# Patient Record
Sex: Female | Born: 1955 | Race: White | Hispanic: No | Marital: Married | Smoking: Never smoker
Health system: Southern US, Community
[De-identification: ages and names within clinical notes are randomized; demographics above are authoritative.]

## PROBLEM LIST (undated history)

## (undated) DIAGNOSIS — G459 Transient cerebral ischemic attack, unspecified: Secondary | ICD-10-CM

## (undated) DIAGNOSIS — I10 Essential (primary) hypertension: Secondary | ICD-10-CM

## (undated) DIAGNOSIS — E119 Type 2 diabetes mellitus without complications: Secondary | ICD-10-CM

## (undated) HISTORY — PX: ABDOMINAL HYSTERECTOMY: SHX81

---

## 2020-11-27 ENCOUNTER — Emergency Department (HOSPITAL_COMMUNITY)

## 2020-11-27 ENCOUNTER — Other Ambulatory Visit: Payer: Self-pay

## 2020-11-27 ENCOUNTER — Encounter (HOSPITAL_COMMUNITY): Payer: Self-pay | Admitting: Emergency Medicine

## 2020-11-27 ENCOUNTER — Observation Stay (HOSPITAL_COMMUNITY)
Admission: EM | Admit: 2020-11-27 | Discharge: 2020-11-29 | Disposition: A | Discharge status: Home / Self Care | Attending: Internal Medicine | Admitting: Internal Medicine

## 2020-11-27 DIAGNOSIS — G4733 Obstructive sleep apnea (adult) (pediatric): Secondary | ICD-10-CM | POA: Diagnosis not present

## 2020-11-27 DIAGNOSIS — E782 Mixed hyperlipidemia: Secondary | ICD-10-CM | POA: Diagnosis present

## 2020-11-27 DIAGNOSIS — I951 Orthostatic hypotension: Secondary | ICD-10-CM | POA: Diagnosis not present

## 2020-11-27 DIAGNOSIS — I10 Essential (primary) hypertension: Secondary | ICD-10-CM | POA: Insufficient documentation

## 2020-11-27 DIAGNOSIS — Z7982 Long term (current) use of aspirin: Secondary | ICD-10-CM | POA: Diagnosis not present

## 2020-11-27 DIAGNOSIS — Z8673 Personal history of transient ischemic attack (TIA), and cerebral infarction without residual deficits: Secondary | ICD-10-CM | POA: Insufficient documentation

## 2020-11-27 DIAGNOSIS — Z7984 Long term (current) use of oral hypoglycemic drugs: Secondary | ICD-10-CM | POA: Insufficient documentation

## 2020-11-27 DIAGNOSIS — Z7902 Long term (current) use of antithrombotics/antiplatelets: Secondary | ICD-10-CM | POA: Insufficient documentation

## 2020-11-27 DIAGNOSIS — Z20822 Contact with and (suspected) exposure to covid-19: Secondary | ICD-10-CM | POA: Diagnosis not present

## 2020-11-27 DIAGNOSIS — I6523 Occlusion and stenosis of bilateral carotid arteries: Secondary | ICD-10-CM | POA: Diagnosis present

## 2020-11-27 DIAGNOSIS — F411 Generalized anxiety disorder: Secondary | ICD-10-CM | POA: Diagnosis present

## 2020-11-27 DIAGNOSIS — R55 Syncope and collapse: Secondary | ICD-10-CM

## 2020-11-27 DIAGNOSIS — E119 Type 2 diabetes mellitus without complications: Secondary | ICD-10-CM

## 2020-11-27 DIAGNOSIS — Z79899 Other long term (current) drug therapy: Secondary | ICD-10-CM | POA: Insufficient documentation

## 2020-11-27 DIAGNOSIS — R4182 Altered mental status, unspecified: Principal | ICD-10-CM | POA: Insufficient documentation

## 2020-11-27 DIAGNOSIS — R4020 Unspecified coma: Secondary | ICD-10-CM | POA: Diagnosis present

## 2020-11-27 DIAGNOSIS — I959 Hypotension, unspecified: Secondary | ICD-10-CM | POA: Diagnosis present

## 2020-11-27 HISTORY — DX: Transient cerebral ischemic attack, unspecified: G45.9

## 2020-11-27 HISTORY — DX: Type 2 diabetes mellitus without complications: E11.9

## 2020-11-27 HISTORY — DX: Essential (primary) hypertension: I10

## 2020-11-27 LAB — COMPREHENSIVE METABOLIC PANEL
ALT: 15 U/L (ref 0–44)
AST: 19 U/L (ref 15–41)
Albumin: 3.5 g/dL (ref 3.5–5.0)
Alkaline Phosphatase: 37 U/L — ABNORMAL LOW (ref 38–126)
Anion gap: 13 (ref 5–15)
BUN: 10 mg/dL (ref 8–23)
CO2: 19 mmol/L — ABNORMAL LOW (ref 22–32)
Calcium: 9.2 mg/dL (ref 8.9–10.3)
Chloride: 99 mmol/L (ref 98–111)
Creatinine, Ser: 0.88 mg/dL (ref 0.44–1.00)
GFR, Estimated: >60 mL/min (ref >60)
Glucose, Bld: 131 mg/dL — ABNORMAL HIGH (ref 70–99)
Potassium: 3.8 mmol/L (ref 3.5–5.1)
Sodium: 131 mmol/L — ABNORMAL LOW (ref 135–145)
Total Bilirubin: 0.8 mg/dL (ref 0.3–1.2)
Total Protein: 6.6 g/dL (ref 6.5–8.1)

## 2020-11-27 LAB — CBC WITH DIFFERENTIAL/PLATELET
Abs Immature Granulocytes: 0.02 10*3/uL (ref 0.00–0.07)
Basophils Absolute: 0.1 10*3/uL (ref 0.0–0.1)
Basophils Relative: 1 %
Eosinophils Absolute: 0.1 10*3/uL (ref 0.0–0.5)
Eosinophils Relative: 2 %
HCT: 38.7 % (ref 36.0–46.0)
Hemoglobin: 13 g/dL (ref 12.0–15.0)
Immature Granulocytes: 0 %
Lymphocytes Relative: 28 %
Lymphs Abs: 2.3 10*3/uL (ref 0.7–4.0)
MCH: 29.6 pg (ref 26.0–34.0)
MCHC: 33.6 g/dL (ref 30.0–36.0)
MCV: 88.2 fL (ref 80.0–100.0)
Monocytes Absolute: 0.6 10*3/uL (ref 0.1–1.0)
Monocytes Relative: 8 %
Neutro Abs: 5.1 10*3/uL (ref 1.7–7.7)
Neutrophils Relative %: 61 %
Platelets: 411 10*3/uL — ABNORMAL HIGH (ref 150–400)
RBC: 4.39 MIL/uL (ref 3.87–5.11)
RDW: 13.5 % (ref 11.5–15.5)
WBC: 8.3 10*3/uL (ref 4.0–10.5)
nRBC: 0 % (ref 0.0–0.2)

## 2020-11-27 LAB — POC SARS CORONAVIRUS 2 AG -  ED: SARS Coronavirus 2 Ag: NEGATIVE

## 2020-11-27 LAB — D-DIMER, QUANTITATIVE: D-Dimer, Quant: 0.52 ug/mL-FEU — ABNORMAL HIGH (ref 0.00–0.50)

## 2020-11-27 LAB — TROPONIN I (HIGH SENSITIVITY)
Troponin I (High Sensitivity): 5 ng/L (ref <18)
Troponin I (High Sensitivity): 5 ng/L (ref <18)

## 2020-11-27 LAB — I-STAT BETA HCG BLOOD, ED (MC, WL, AP ONLY): I-stat hCG, quantitative: <5 m[IU]/mL (ref <5)

## 2020-11-27 LAB — CBG MONITORING, ED: Glucose-Capillary: 126 mg/dL — ABNORMAL HIGH (ref 70–99)

## 2020-11-27 IMAGING — DX DG CHEST 1V PORT
1 series · 1 of 1 positions shown · non-contrast
Comparison: None

CLINICAL DATA: Hypertension, syncope, altered mental status.

EXAM:
PORTABLE CHEST 1 VIEW

[chest ap]
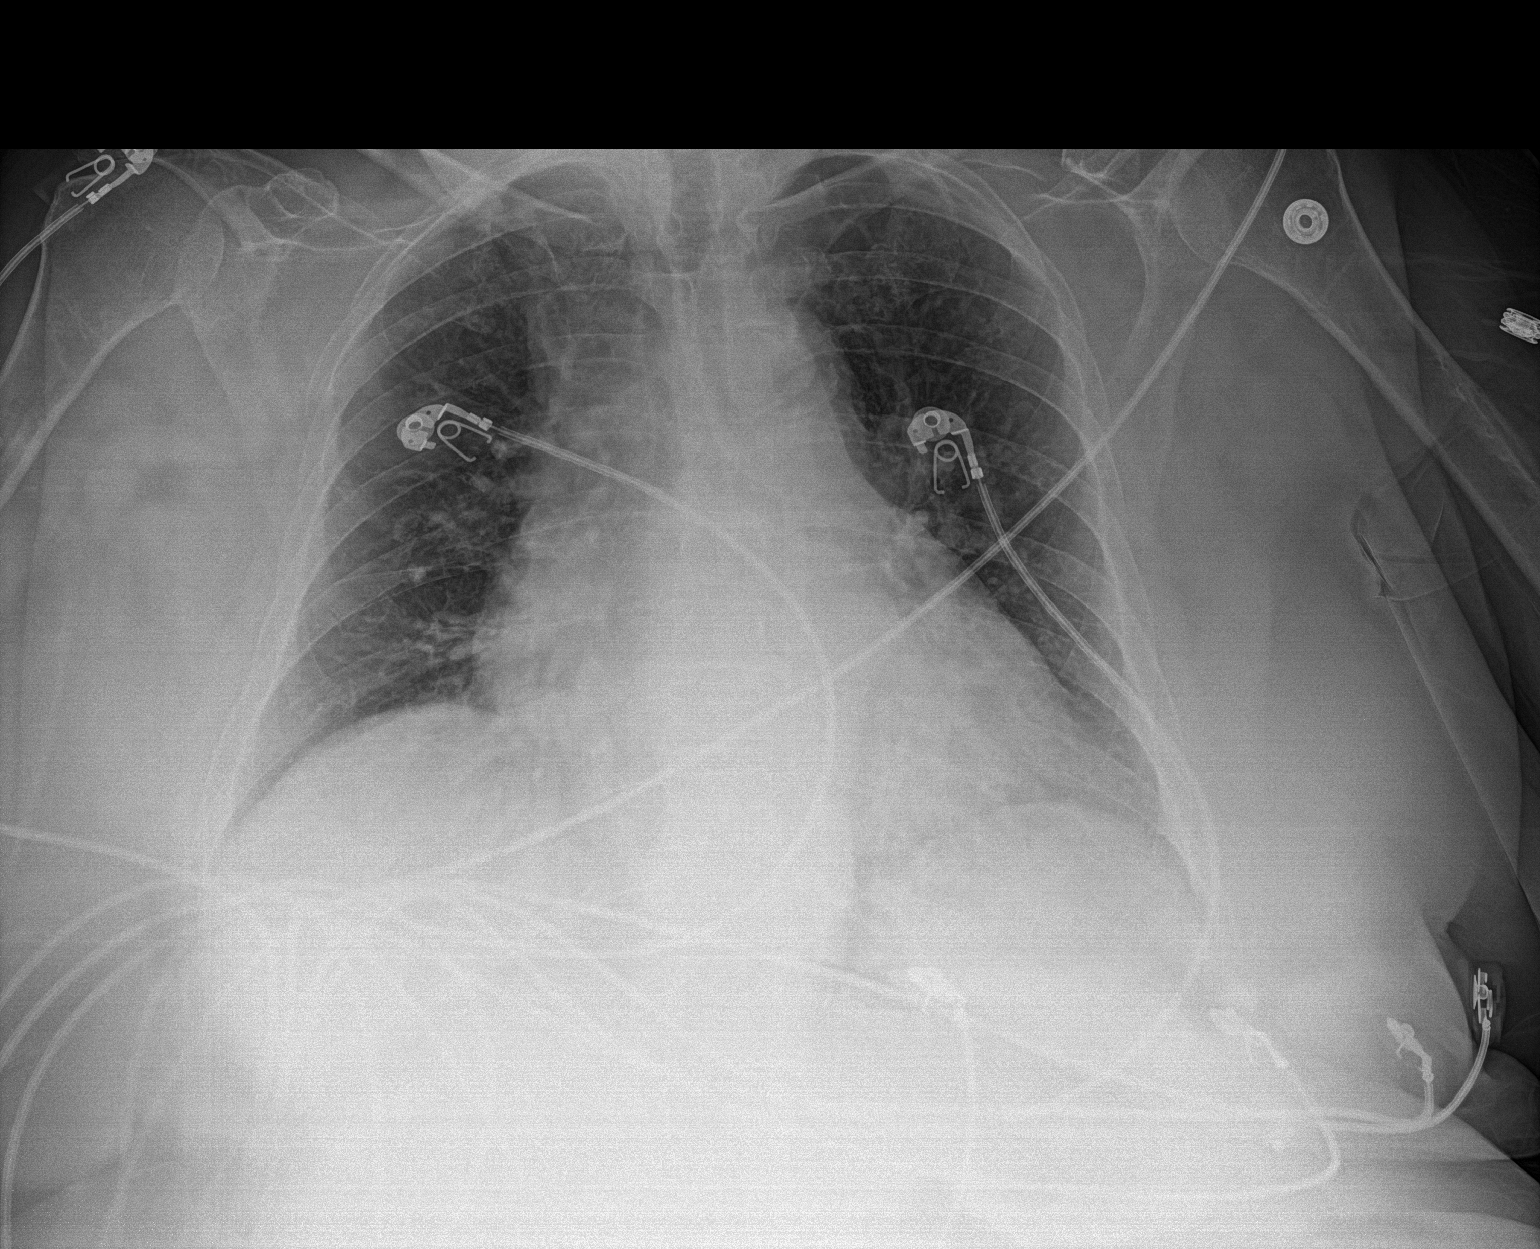

[1 of 1 positions shown; findings below may reference images not displayed]

FINDINGS: Heart is mildly enlarged. No confluent airspace opacities, effusions
or edema. Mediastinal contours within normal limits. No acute bony
abnormality.
IMPRESSION: Cardiomegaly.  No active disease.

## 2020-11-27 IMAGING — US US AORTA
1 series · 11 of 11 positions shown · non-contrast
Comparison: None.

CLINICAL DATA: Hypotension, syncope.  Abdominal aneurysm?

EXAM:
ULTRASOUND OF ABDOMINAL AORTA
TECHNIQUE: Ultrasound examination of the abdominal aorta and proximal common
iliac arteries was performed to evaluate for aneurysm. Additional
color and Doppler images of the distal aorta were obtained to
document patency.

[Series 1: us aorta · 11 of 11 slices shown]
[im 1/11]
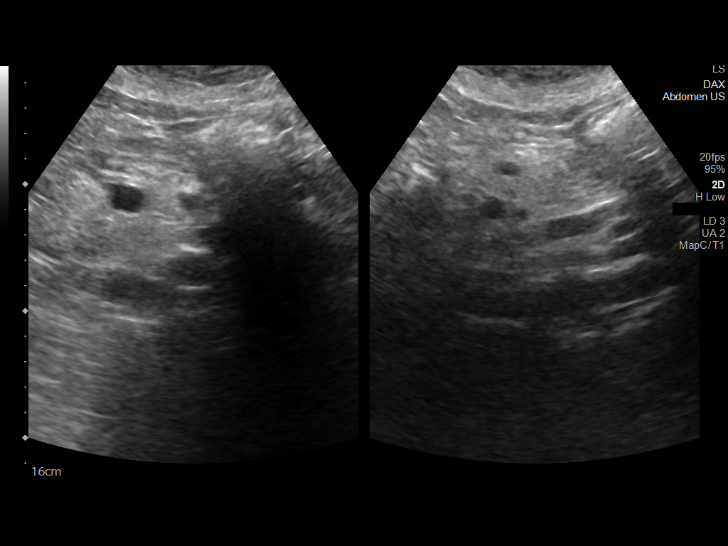
[im 2/11]
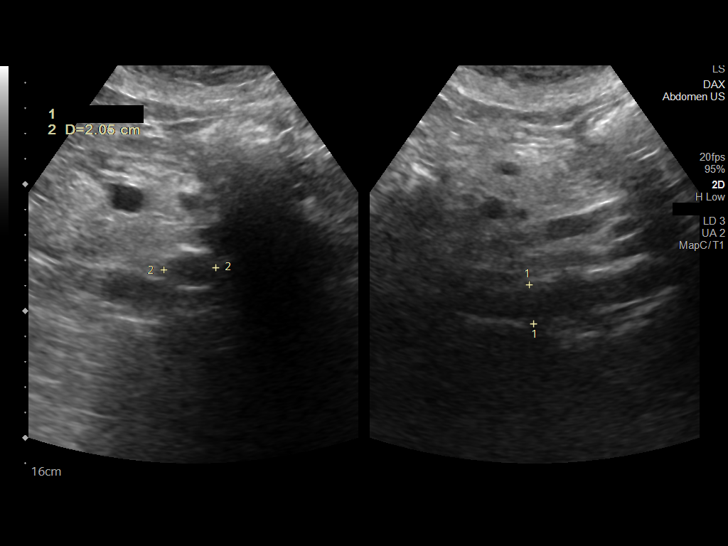
[im 3/11]
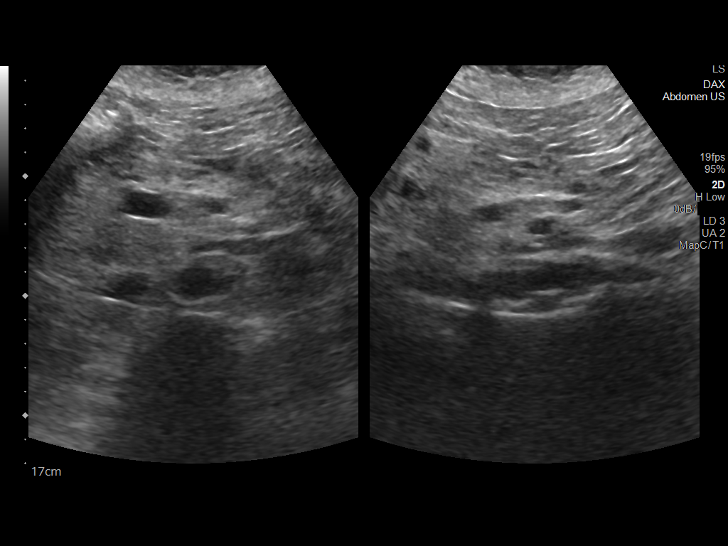
[im 4/11]
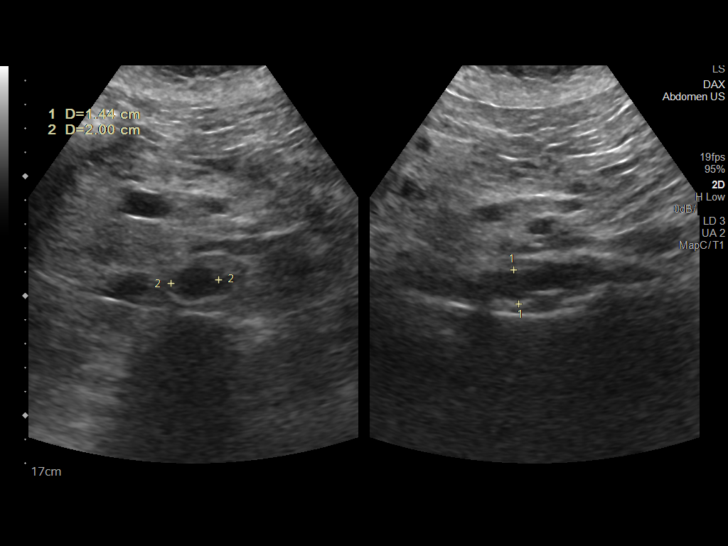
[im 5/11]
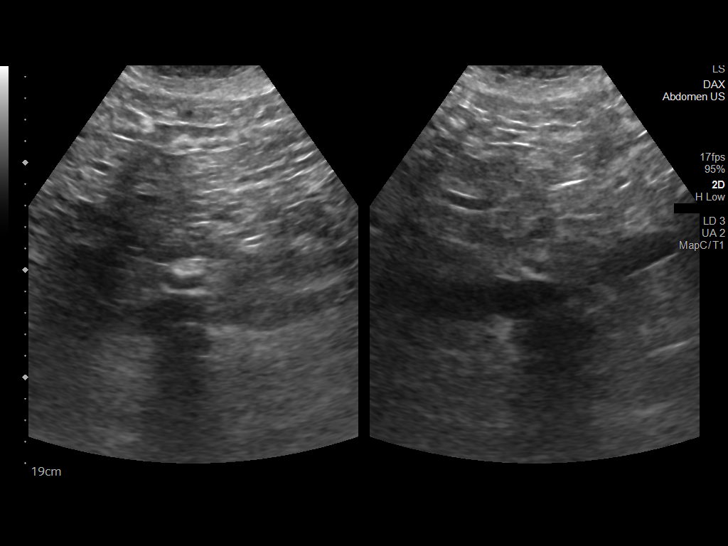
[im 6/11]
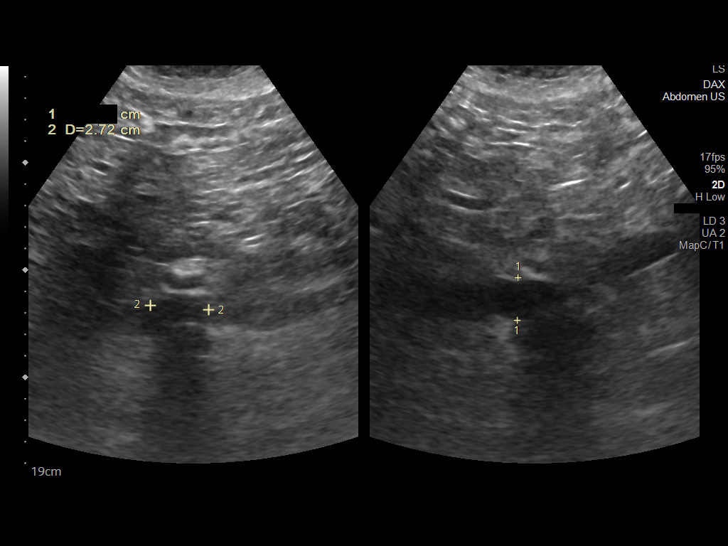
[im 7/11]
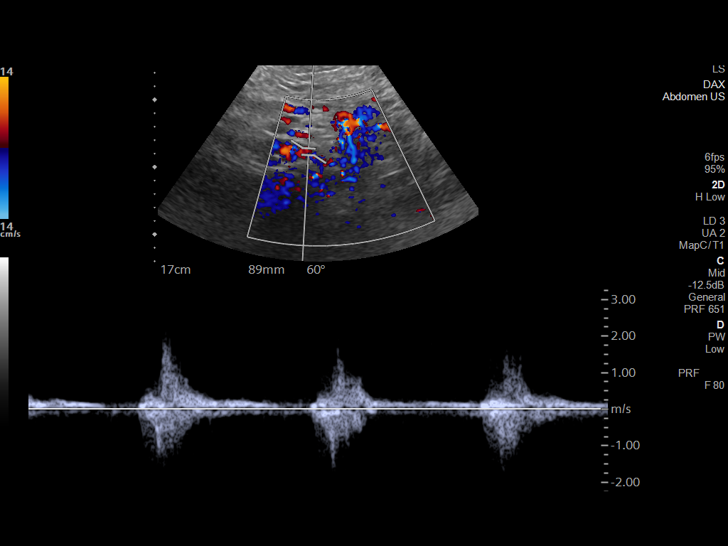
[im 8/11]
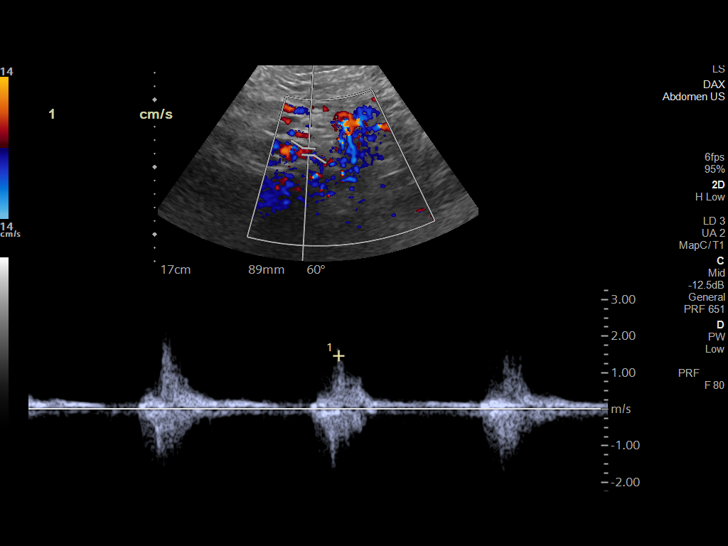
[im 9/11]
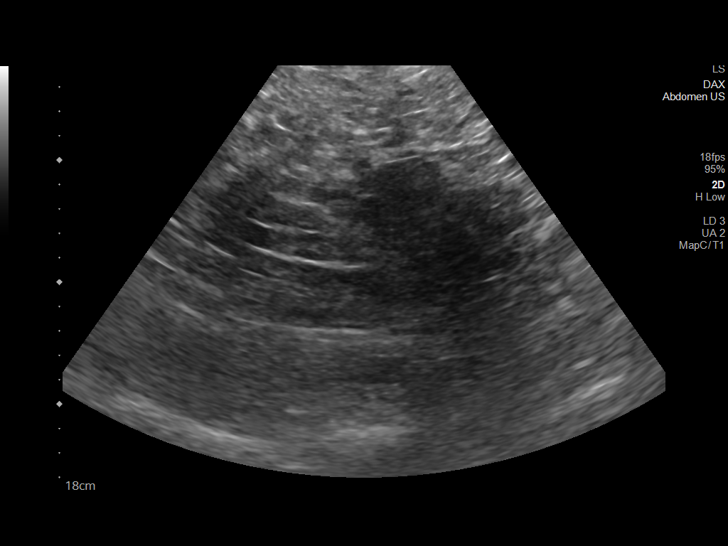
[im 10/11]
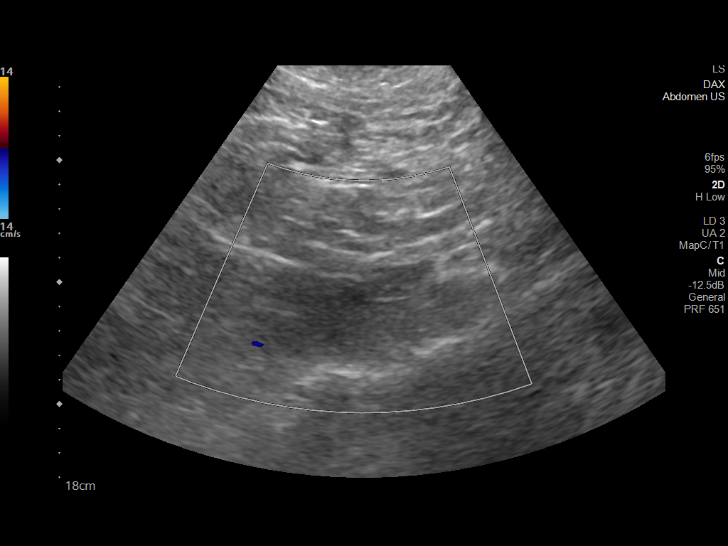
[im 11/11]
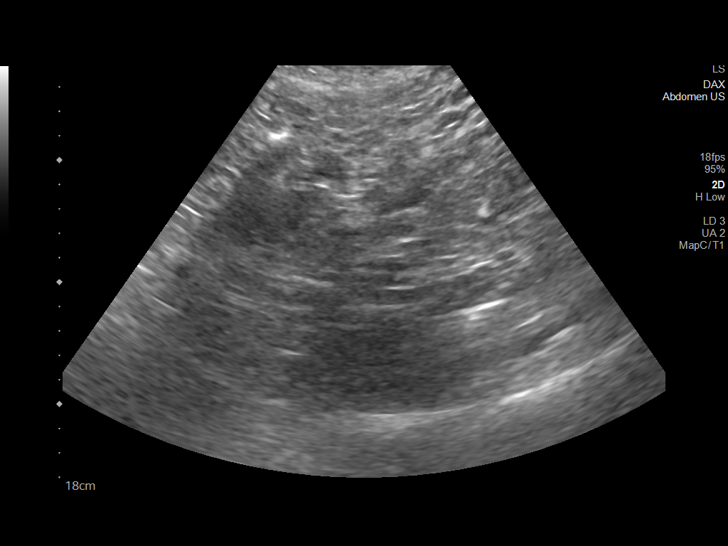

[11 of 11 positions shown; findings below may reference images not displayed]

FINDINGS: Abdominal aortic measurements as follows:

Proximal:  1.6 x 2.1 cm cm

Mid:  1.4 x 2.0 cm cm

Distal:  2 x 2.7 cm cm
Patent: Yes, peak systolic velocity is 146 cm/s

Common iliac arteries: Not seen.
IMPRESSION: No abdominal aortic aneurysm.

## 2020-11-27 IMAGING — CT CT HEAD W/O CM
3 of 4 series · 16 of 47 positions shown, 19 images · non-contrast
Comparison: None.

CLINICAL DATA: Mental status change, unknown cause

EXAM:
CT HEAD WITHOUT CONTRAST
TECHNIQUE: Contiguous axial images were obtained from the base of the skull
through the vertex without intravenous contrast.

[Series 4: head 2.0 h70h · axial · 0.41mm/px · z∈[-115,+35]mm · 10 of 85 slices shown, 13 images]
[im 5/85  brain]
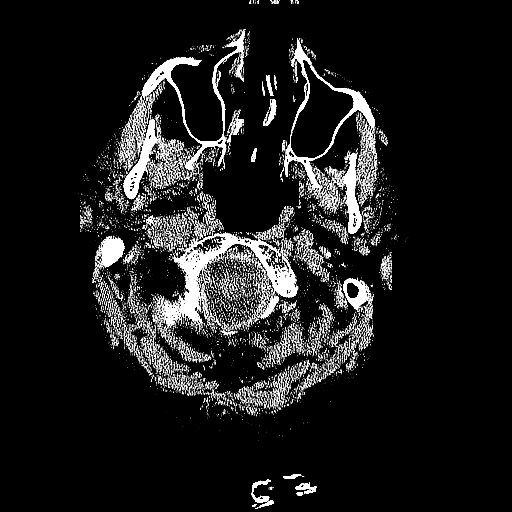
[im 5/85  bone]
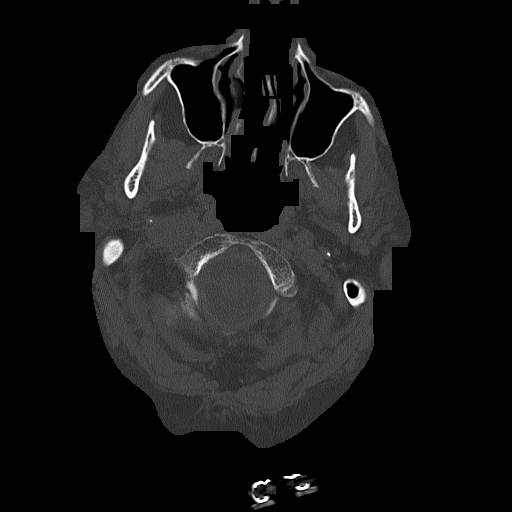
[im 13/85  brain]
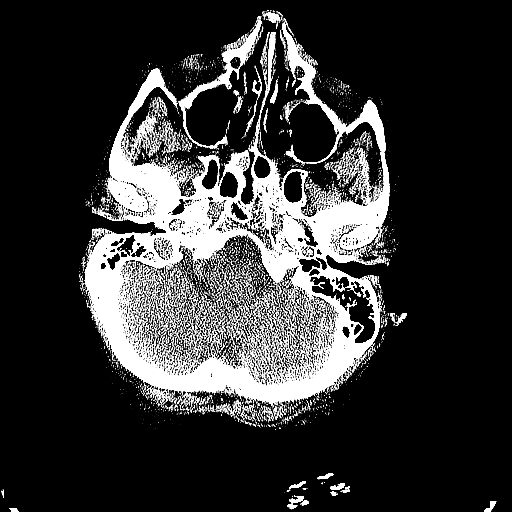
[im 22/85  brain]
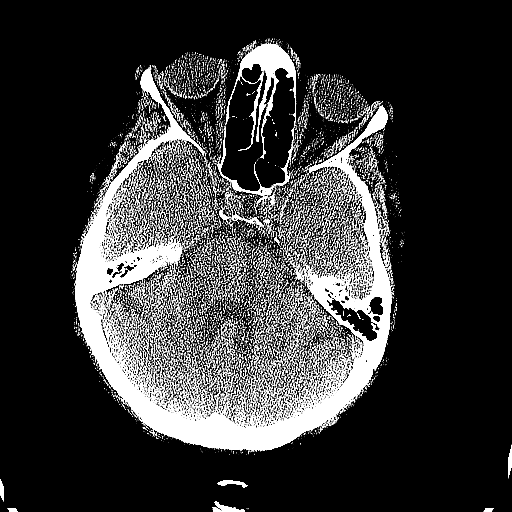
[im 30/85  brain]
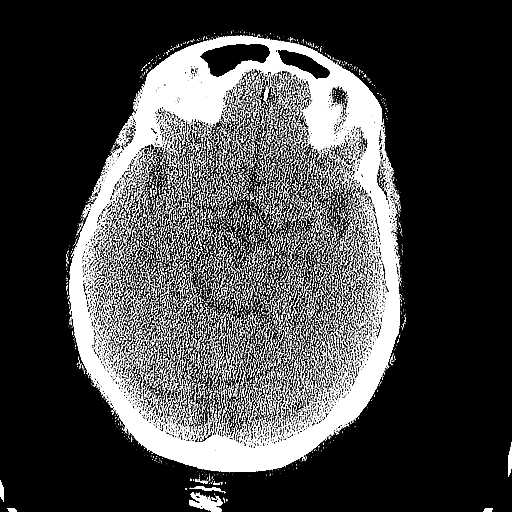
[im 38/85  brain]
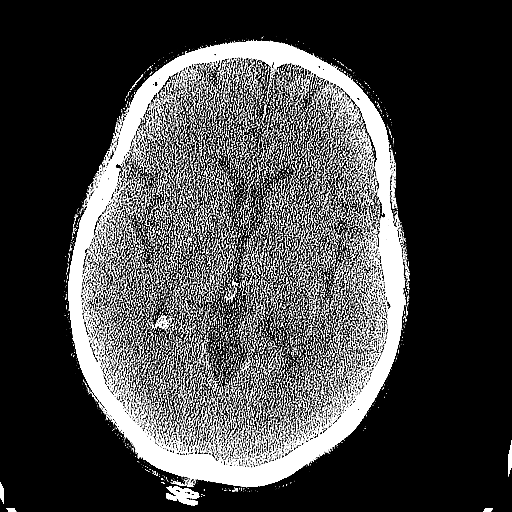
[im 38/85  bone]
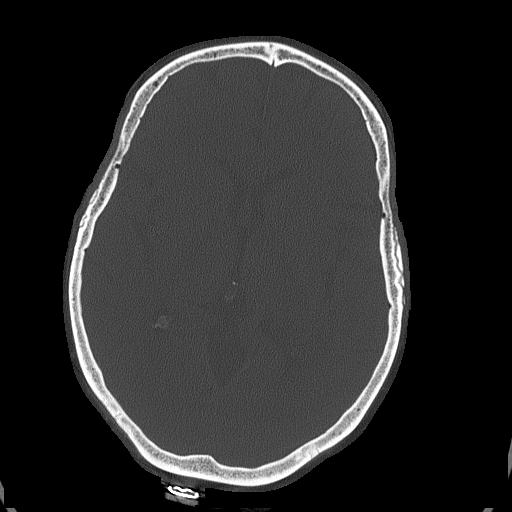
[im 47/85  brain]
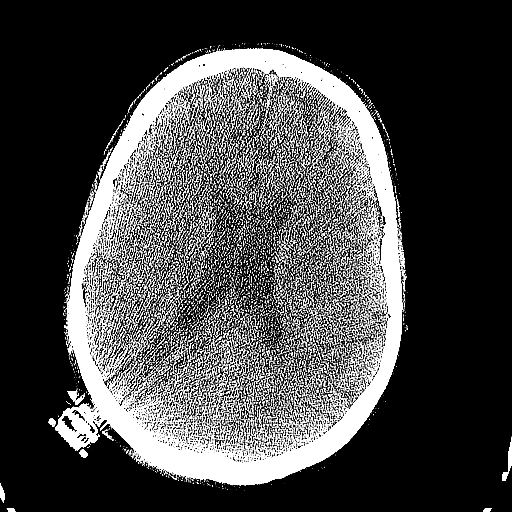
[im 55/85  brain]
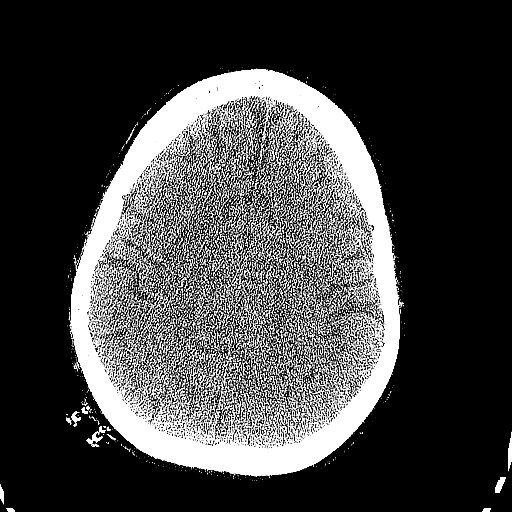
[im 64/85  brain]
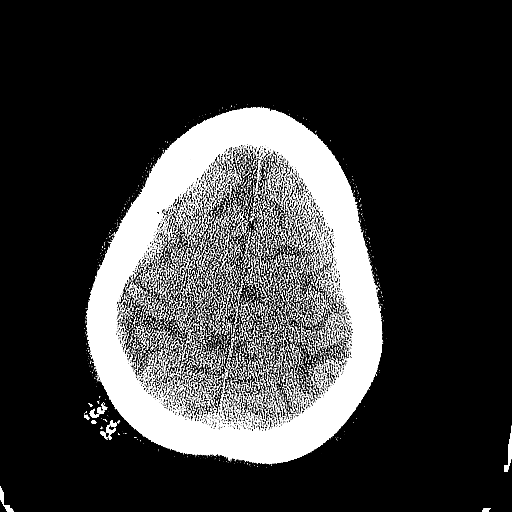
[im 72/85  brain]
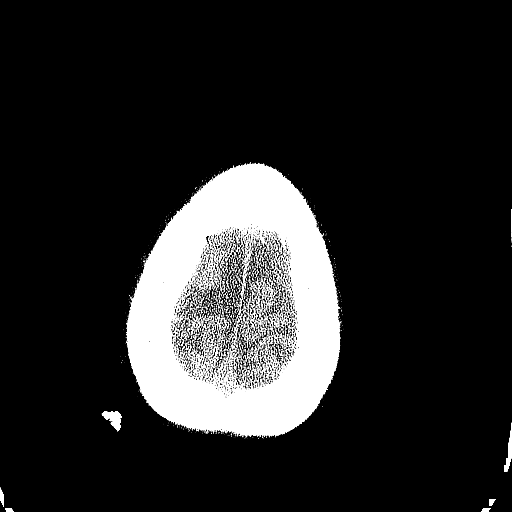
[im 72/85  bone]
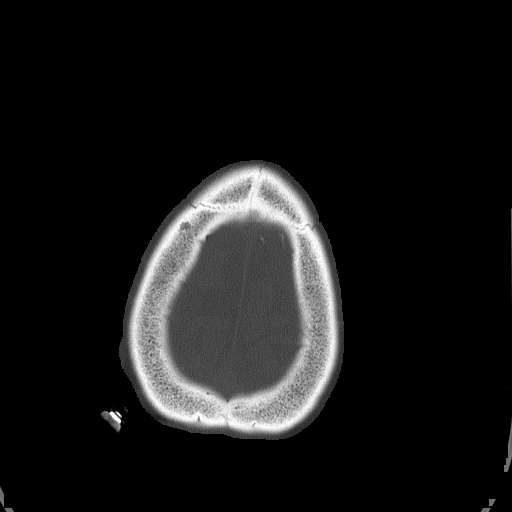
[im 80/85  brain]
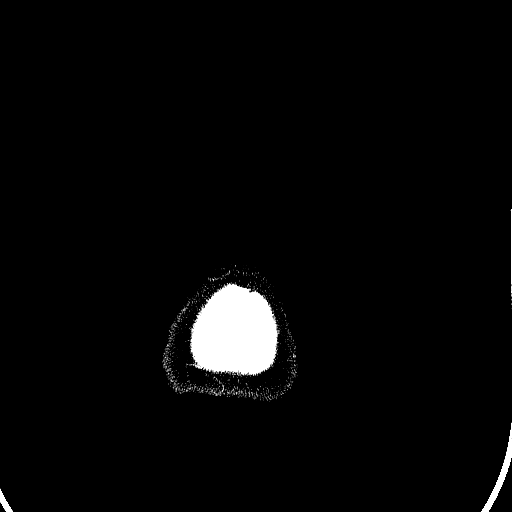

[Series 5: head 3.0 mpr cor · coronal · 0.33mm/px · 3 of 68 slices shown]
[im 23/68  brain]
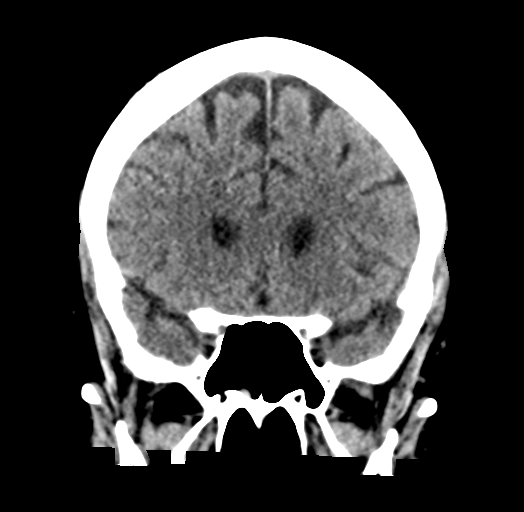
[im 30/68  brain]
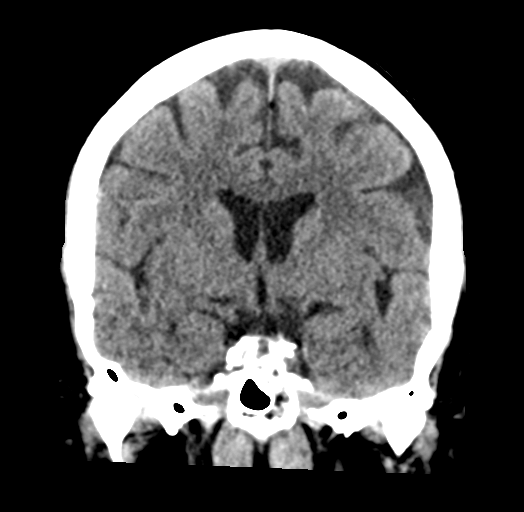
[im 38/68  brain]
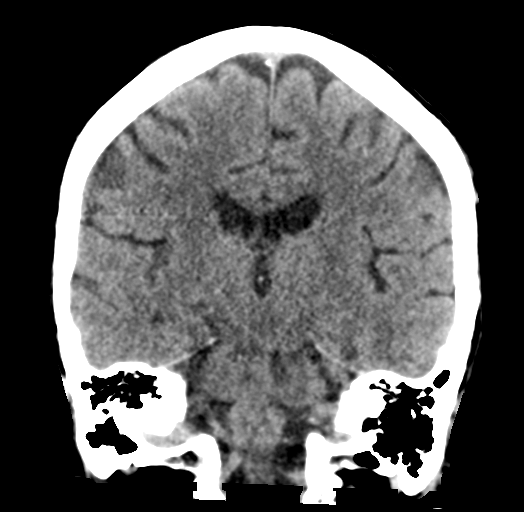

[Series 6: head 3.0 mpr sag · sagittal · 0.32mm/px · 3 of 57 slices shown]
[im 19/57  brain]
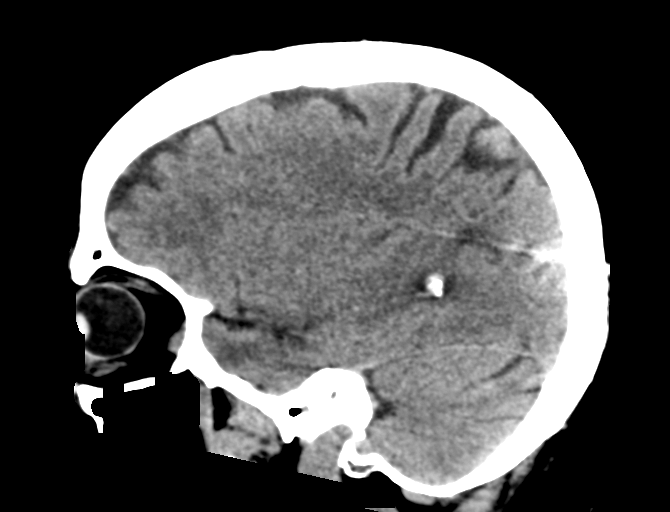
[im 29/57  brain]
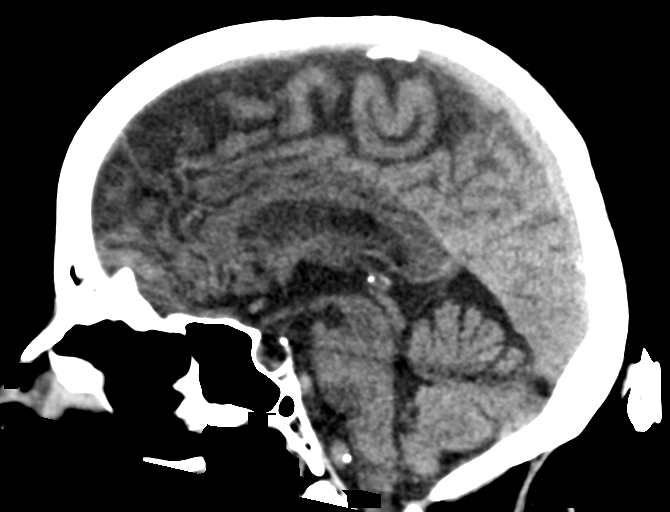
[im 38/57  brain]
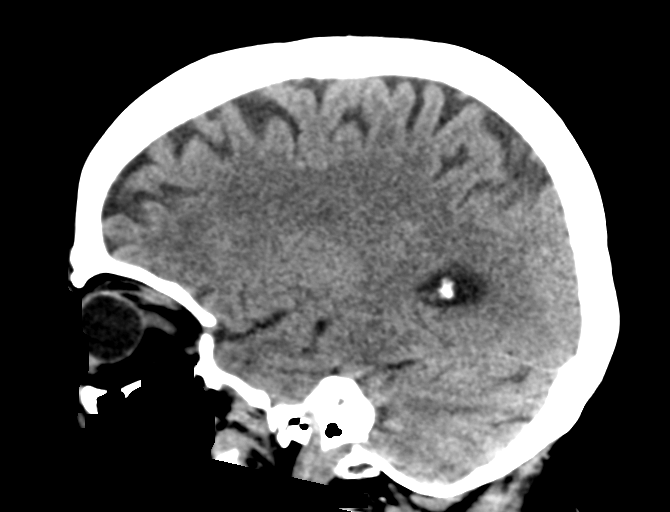

[16 of 47 positions shown; findings below may reference images not displayed]

FINDINGS: Study is degraded by beam hardening artifact from metallic densities
external to the scalp along the posterior calvarium.

Brain: No evidence of acute infarction, hemorrhage, hydrocephalus,
extra-axial collection or mass lesion/mass effect.

Vascular: No hyperdense vessel or unexpected calcification.

Skull: Normal. Negative for fracture or focal lesion.

Sinuses/Orbits: No acute finding.

Other: None.
IMPRESSION: No evidence of acute intracranial abnormality on this study which is
slightly degraded by beam hardening artifact.

## 2020-11-27 MED ORDER — SODIUM CHLORIDE 0.9 % IV SOLN: INTRAVENOUS | Status: DC

## 2020-11-27 MED ORDER — SODIUM CHLORIDE 0.9 % IV BOLUS
1000.0000 mL | Freq: Once | INTRAVENOUS | Status: AC
  Administered 2020-11-27: 1000 mL via INTRAVENOUS

## 2020-11-27 NOTE — ED Notes (Signed)
Pt states she feels weak and unable to perform orthostatic v/s at this time.

## 2020-11-27 NOTE — ED Provider Notes (Signed)
[facility] EMERGENCY DEPARTMENT Provider Note   CSN: [id number] Arrival date & time: 11/27/20  1546     History Chief Complaint  Patient presents with  . Loss of Consciousness    Sydney Baird is a 65 y.o. female.  HPI Patient presents via EMS after an soda syncope, hypotension, possible pulselessness. Patient cannot provide any details, level 5 caveat secondary to acuity. Per EMS report the patient was at [employer], eating, when she began tapping the table, and suddenly lost consciousness. Seemingly the patient had no pulses, no CPR was not reportedly provided. Patient was 50/30 on EMS arrival, and received 500 mL fluids, epi bolus, epi drip.  On arrival patient does grunt, cannot answer questions.    Medical, past surgical, social, unavailable secondary to mental status changes, hypotension   Home Medications Prior to Admission medications   Not on File    Allergies    Patient has no known allergies.  Review of Systems   Review of Systems  Unable to perform ROS: Acuity of condition    Physical Exam Updated Vital Signs BP (!) 125/58   Pulse 63   Temp (!) 97.5 F (36.4 C) (Axillary)   Resp 18   Ht 4\' 11"  (1.499 m)   Wt 90.7 kg   SpO2 100%   BMI 40.40 kg/m   Physical Exam Vitals and nursing note reviewed.  Constitutional:      Appearance: She is well-developed and well-nourished. She is obese.     Comments: Obese elderly female seemingly comfortable, resting with her eyes open, does follow commands, to track visually, but offers minimal verbal responses  HENT:     Head: Normocephalic and atraumatic.  Eyes:     Extraocular Movements: EOM normal.     Conjunctiva/sclera: Conjunctivae normal.  Cardiovascular:     Rate and Rhythm: Normal rate and regular rhythm.  Pulmonary:     Effort: Pulmonary effort is normal. No respiratory distress.     Breath sounds: Normal breath sounds. No stridor.  Abdominal:     General: There is  no distension.     Tenderness: There is no abdominal tenderness. There is no guarding.  Musculoskeletal:        General: No edema.  Skin:    General: Skin is warm and dry.  Neurological:     Comments: Listless, no facial asymmetry, speech is monosyllabic, inconsistent, disoriented.  Patient does not follow all commands, only some occasionally.  Psychiatric:        Mood and Affect: Mood and affect normal.        Cognition and Memory: Cognition is impaired. Memory is impaired.     ED Results / Procedures / Treatments   Labs (all labs ordered are listed, but only abnormal results are displayed) Labs Reviewed  COMPREHENSIVE METABOLIC PANEL - Abnormal; Notable for the following components:      Result Value   Sodium 131 (*)    CO2 19 (*)    Glucose, Bld 131 (*)    Alkaline Phosphatase 37 (*)    All other components within normal limits  CBC WITH DIFFERENTIAL/PLATELET - Abnormal; Notable for the following components:   Platelets 411 (*)    All other components within normal limits  CBG MONITORING, ED - Abnormal; Notable for the following components:   Glucose-Capillary 126 (*)    All other components within normal limits  D-DIMER, QUANTITATIVE  CBG MONITORING, ED  I-STAT BETA HCG BLOOD, ED ([facility], [facility], [facility]  ONLY)  TROPONIN I (HIGH SENSITIVITY)    EKG EKG Interpretation  Date/Time:  Wednesday November 27 2020 16:06:15 EST Ventricular Rate:  68 PR Interval:    QRS Duration: 81 QT Interval:  464 QTC Calculation: 494 R Axis:   62 Text Interpretation: Sinus rhythm Nonspecific T abnormalities, inferior leads Borderline prolonged QT interval No significant change since last tracing Abnormal ECG Confirmed by Gerhard Munch [phone]) on 11/27/2020 4:27:58 PM   Radiology CT HEAD WO CONTRAST  Result Date: 11/27/2020 CLINICAL DATA:  Mental status change, unknown cause EXAM: CT HEAD WITHOUT CONTRAST TECHNIQUE: Contiguous axial images were obtained from the base of the skull through the vertex  without intravenous contrast. COMPARISON:  None. FINDINGS: Study is degraded by beam hardening artifact from metallic densities external to the scalp along the posterior calvarium. Brain: No evidence of acute infarction, hemorrhage, hydrocephalus, extra-axial collection or mass lesion/mass effect. Vascular: No hyperdense vessel or unexpected calcification. Skull: Normal. Negative for fracture or focal lesion. Sinuses/Orbits: No acute finding. Other: None. IMPRESSION: No evidence of acute intracranial abnormality on this study which is slightly degraded by beam hardening artifact. Electronically Signed   By: Maudry Mayhew MD   On: 11/27/2020 16:37   US Aorta  Result Date: 11/27/2020 CLINICAL DATA:  Hypotension, syncope.  Abdominal aneurysm? EXAM: ULTRASOUND OF ABDOMINAL AORTA TECHNIQUE: Ultrasound examination of the abdominal aorta and proximal common iliac arteries was performed to evaluate for aneurysm. Additional color and Doppler images of the distal aorta were obtained to document patency. COMPARISON:  None. FINDINGS: Abdominal aortic measurements as follows: Proximal:  1.6 x 2.1 cm cm Mid:  1.4 x 2.0 cm cm Distal:  2 x 2.7 cm cm Patent: Yes, peak systolic velocity is 146 cm/s Common iliac arteries: Not seen. IMPRESSION: No abdominal aortic aneurysm. Electronically Signed   By: Bary Richard M.D.   On: 11/27/2020 18:20   DG Chest Port 1 View  Result Date: 11/27/2020 CLINICAL DATA:  Hypertension, syncope, altered mental status. EXAM: PORTABLE CHEST 1 VIEW COMPARISON:  None FINDINGS: Heart is mildly enlarged. No confluent airspace opacities, effusions or edema. Mediastinal contours within normal limits. No acute bony abnormality. IMPRESSION: Cardiomegaly.  No active disease. Electronically Signed   By: Charlett Nose M.D.   On: 11/27/2020 16:49    Procedures Procedures   Medications Ordered in ED Medications  sodium chloride 0.9 % bolus 1,000 mL (0 mLs Intravenous Stopped 11/27/20 1624)    And  0.9 %   sodium chloride infusion ( Intravenous New Bag/Given 11/27/20 1626)    ED Course  I have reviewed the triage vital signs and the nursing notes.  Pertinent labs & imaging results that were available during my care of the patient were reviewed by me and considered in my medical decision making (see chart for details).     With consideration with her mental status, hypotension, possible cardiac arrest versus syncope, versus vagal episode versus aortic disruption, the patient had labs, CT, ultrasound, x-ray, continuous monitoring, pulse oximetry ordered. Initial cardiac 50/60, sinus, unremarkable Pulse oximetry 96% nasal cannula abnormal   Update:, Patient accompanied by her husband. He was not with the patient when she had her episode of syncope. She is now answering questions more appropriately, cannot provide additional details other than stating that she remembers being at lunch.  6:54 PM Initial CT head, ultrasound aorta, x-ray reviewed, discussed, no evidence for intracranial hemorrhage, aortic disruption, pneumonia. Initial labs generally unremarkable, blood pressure has normalized following fluid resuscitation. Patient is now  capable of providing additional details of her history.  She notes that today she was feeling" off" prior to going to lunch, and prior to the episode recalls feeling weaker than usual.  Features are inconsistent with potential vagal episode. She now also complains of chest and back pain, which is a new complaint. Given the patient's episode of syncope, chest and back pain, severe hypertension 50/30, additional evaluation is required, admission indicated.  Update:, Patient continues to improve, remains awake and alert, normotensive. Additional findings notable for D-dimer that is trivially elevated, but age-adjusted negative.  Aortic dissection risk score is 0, and with unremarkable abdominal ultrasound of the aorta, low suspicion for dissection. Patient has had no  arrhythmia here, though that remains a consideration, as is vagal episode given her description of syncope.  On however, with unclear circumstances, reported period of pulselessness, as well as need for epinephrine, in the context of syncope, the patient was admitted for further monitoring, management.  Final Clinical Impression(s) / ED Diagnoses Final diagnoses:  Syncope and collapse     Gerhard Munch, MD 11/27/20 2317

## 2020-11-27 NOTE — ED Triage Notes (Signed)
Pt BIB EMS from [facility] where he was with her family. Pt awake and about to eat when she began tapping the table, then became completely unresponsive. On EMS arrival, pt gray, pulseless and unresponsive, BP 50/30, HR 60, but breathing spontaneously. NO CPR preformed. Started on epi drip at 54mcg/min with improvement in mental status, BP (127/60), and color. Pt lethargic and nonverbal on arrival to ED.

## 2020-11-28 ENCOUNTER — Observation Stay (HOSPITAL_COMMUNITY)

## 2020-11-28 ENCOUNTER — Encounter (HOSPITAL_COMMUNITY): Payer: Self-pay | Admitting: Internal Medicine

## 2020-11-28 ENCOUNTER — Observation Stay (HOSPITAL_BASED_OUTPATIENT_CLINIC_OR_DEPARTMENT_OTHER)

## 2020-11-28 DIAGNOSIS — F411 Generalized anxiety disorder: Secondary | ICD-10-CM | POA: Diagnosis not present

## 2020-11-28 DIAGNOSIS — R4020 Unspecified coma: Secondary | ICD-10-CM | POA: Diagnosis not present

## 2020-11-28 DIAGNOSIS — E782 Mixed hyperlipidemia: Secondary | ICD-10-CM | POA: Diagnosis present

## 2020-11-28 DIAGNOSIS — I6523 Occlusion and stenosis of bilateral carotid arteries: Secondary | ICD-10-CM | POA: Diagnosis not present

## 2020-11-28 DIAGNOSIS — I959 Hypotension, unspecified: Secondary | ICD-10-CM | POA: Diagnosis present

## 2020-11-28 DIAGNOSIS — E119 Type 2 diabetes mellitus without complications: Secondary | ICD-10-CM

## 2020-11-28 DIAGNOSIS — R55 Syncope and collapse: Secondary | ICD-10-CM

## 2020-11-28 DIAGNOSIS — I361 Nonrheumatic tricuspid (valve) insufficiency: Secondary | ICD-10-CM | POA: Diagnosis not present

## 2020-11-28 DIAGNOSIS — G4733 Obstructive sleep apnea (adult) (pediatric): Secondary | ICD-10-CM | POA: Diagnosis not present

## 2020-11-28 LAB — COMPREHENSIVE METABOLIC PANEL
ALT: 14 U/L (ref 0–44)
AST: 16 U/L (ref 15–41)
Albumin: 2.9 g/dL — ABNORMAL LOW (ref 3.5–5.0)
Alkaline Phosphatase: 31 U/L — ABNORMAL LOW (ref 38–126)
Anion gap: 10 (ref 5–15)
BUN: 8 mg/dL (ref 8–23)
CO2: 18 mmol/L — ABNORMAL LOW (ref 22–32)
Calcium: 8.2 mg/dL — ABNORMAL LOW (ref 8.9–10.3)
Chloride: 103 mmol/L (ref 98–111)
Creatinine, Ser: 0.69 mg/dL (ref 0.44–1.00)
GFR, Estimated: >60 mL/min (ref >60)
Glucose, Bld: 104 mg/dL — ABNORMAL HIGH (ref 70–99)
Potassium: 4.2 mmol/L (ref 3.5–5.1)
Sodium: 131 mmol/L — ABNORMAL LOW (ref 135–145)
Total Bilirubin: 0.7 mg/dL (ref 0.3–1.2)
Total Protein: 5.5 g/dL — ABNORMAL LOW (ref 6.5–8.1)

## 2020-11-28 LAB — CBG MONITORING, ED
Glucose-Capillary: 104 mg/dL — ABNORMAL HIGH (ref 70–99)
Glucose-Capillary: 99 mg/dL (ref 70–99)
Glucose-Capillary: 90 mg/dL (ref 70–99)

## 2020-11-28 LAB — CBC
HCT: 33.8 % — ABNORMAL LOW (ref 36.0–46.0)
HCT: 37.5 % (ref 36.0–46.0)
Hemoglobin: 11.5 g/dL — ABNORMAL LOW (ref 12.0–15.0)
Hemoglobin: 12.2 g/dL (ref 12.0–15.0)
MCH: 30.6 pg (ref 26.0–34.0)
MCH: 29.1 pg (ref 26.0–34.0)
MCHC: 34 g/dL (ref 30.0–36.0)
MCHC: 32.5 g/dL (ref 30.0–36.0)
MCV: 89.9 fL (ref 80.0–100.0)
MCV: 89.5 fL (ref 80.0–100.0)
Platelets: 338 10*3/uL (ref 150–400)
Platelets: 368 10*3/uL (ref 150–400)
RBC: 3.76 MIL/uL — ABNORMAL LOW (ref 3.87–5.11)
RBC: 4.19 MIL/uL (ref 3.87–5.11)
RDW: 13.6 % (ref 11.5–15.5)
RDW: 13.5 % (ref 11.5–15.5)
WBC: 8.8 10*3/uL (ref 4.0–10.5)
WBC: 8.6 10*3/uL (ref 4.0–10.5)
nRBC: 0 % (ref 0.0–0.2)
nRBC: 0 % (ref 0.0–0.2)

## 2020-11-28 LAB — CREATININE, SERUM
Creatinine, Ser: 0.88 mg/dL (ref 0.44–1.00)
GFR, Estimated: >60 mL/min (ref >60)

## 2020-11-28 LAB — LIPID PANEL
Cholesterol: 179 mg/dL (ref 0–200)
HDL: 34 mg/dL — ABNORMAL LOW (ref >40)
LDL Cholesterol: 113 mg/dL — ABNORMAL HIGH (ref 0–99)
Total CHOL/HDL Ratio: 5.3 RATIO
Triglycerides: 162 mg/dL — ABNORMAL HIGH (ref <150)
VLDL: 32 mg/dL (ref 0–40)

## 2020-11-28 LAB — HEMOGLOBIN A1C
Hgb A1c MFr Bld: 6.5 % — ABNORMAL HIGH (ref 4.8–5.6)
Mean Plasma Glucose: 139.85 mg/dL

## 2020-11-28 LAB — ECHOCARDIOGRAM COMPLETE
Area-P 1/2: 3.21 cm2
Height: 59 in
S' Lateral: 2.8 cm
Weight: 3200 oz

## 2020-11-28 LAB — SARS CORONAVIRUS 2 (TAT 6-24 HRS): SARS Coronavirus 2: NEGATIVE

## 2020-11-28 LAB — GLUCOSE, CAPILLARY: Glucose-Capillary: 99 mg/dL (ref 70–99)

## 2020-11-28 IMAGING — MR MR MRA NECK W/O CM
16 of 20 series · 42 of 48 positions shown · non-contrast
Comparison: None.

CLINICAL DATA: Abnormal mental status



[Series 5: DWI · axial · 3.0mm · 0.88mm/px · z∈[-144,+9]mm · 4 of 104 slices shown (1 of 4)]
[im 1/104]
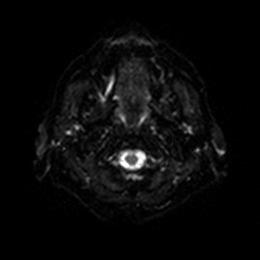
[im 35/104]
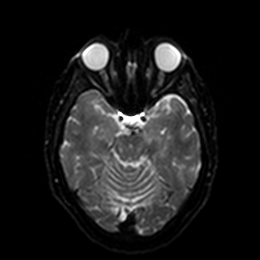
[im 69/104]
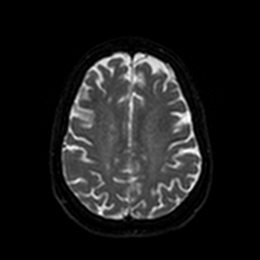
[im 104/104]
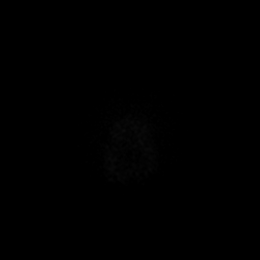

[Series 6: DWI · axial · 3.0mm · 0.88mm/px · z∈[-144,+9]mm · 2 of 52 slices shown (2 of 4)]
[im 1/52]
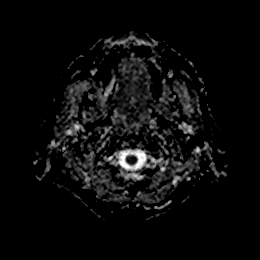
[im 52/52]
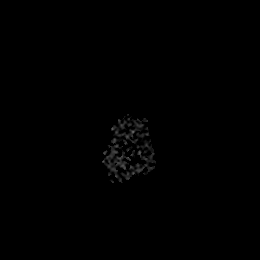

[Series 7: DWI · coronal · 4.0mm · 0.88mm/px · 4 of 68 slices shown (3 of 4)]
[im 1/68]
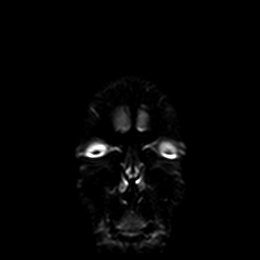
[im 23/68]
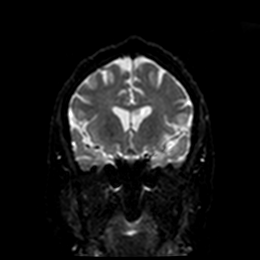
[im 45/68]
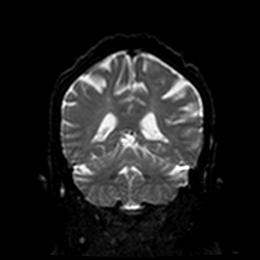
[im 68/68]
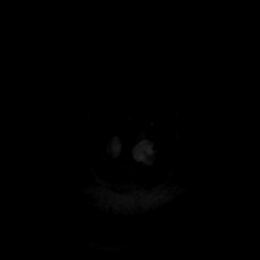

[Series 8: DWI · coronal · 4.0mm · 0.88mm/px · 2 of 34 slices shown (4 of 4)]
[im 1/34]
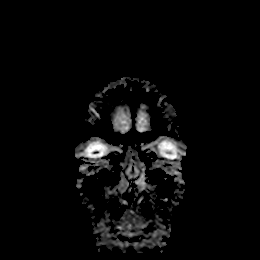
[im 34/34]
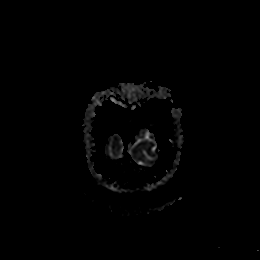

[Series 9: T1 · sagittal · 5.0mm · 0.75mm/px · 1 of 23 slices shown]
[im 1/23]
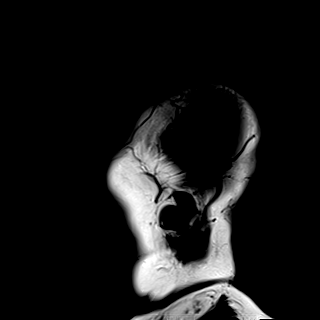

[Series 10: T2 · axial · 5.0mm · 0.72mm/px · 1 of 27 slices shown (1 of 2)]
[im 1/27]
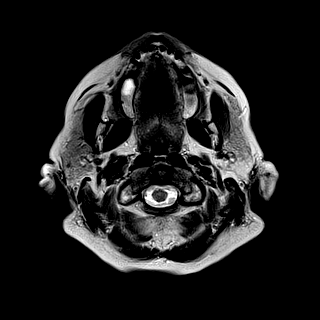

[Series 11: FLAIR · axial · 5.0mm · 0.45mm/px · 1 of 27 slices shown]
[im 1/27]
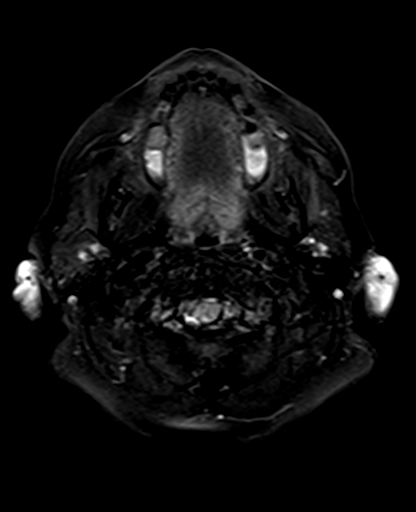

[Series 12: mag_images · axial · 3.0mm · 0.90mm/px · z∈[-144,+9]mm · 3 of 52 slices shown]
[im 1/52]
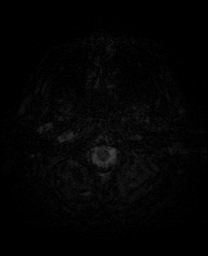
[im 26/52]
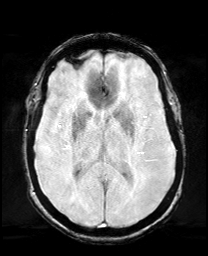
[im 52/52]
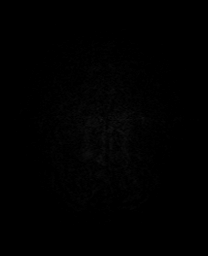

[Series 13: pha_images · axial · 3.0mm · 0.90mm/px · z∈[-144,+6]mm · 3 of 51 slices shown]
[im 1/51]
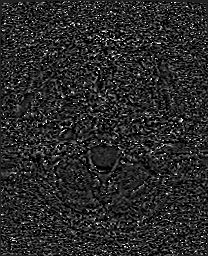
[im 26/51]
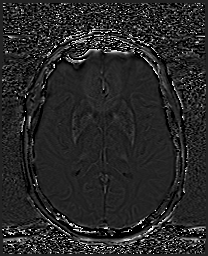
[im 51/51]
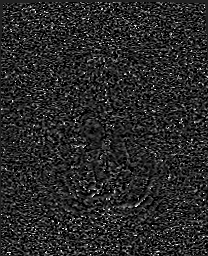

[Series 14: swi_images · axial · 3.0mm · 0.90mm/px · z∈[-144,+9]mm · 3 of 52 slices shown]
[im 1/52]
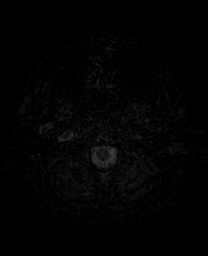
[im 26/52]
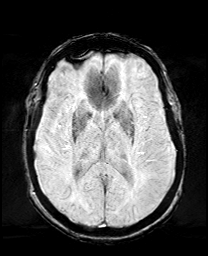
[im 52/52]
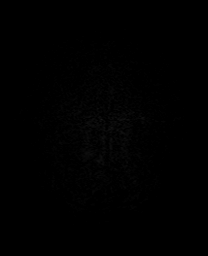

[Series 15: mip_images(sw) · axial · 24.0mm · 0.90mm/px · z∈[-134,-2]mm · 2 of 45 slices shown]
[im 1/45]
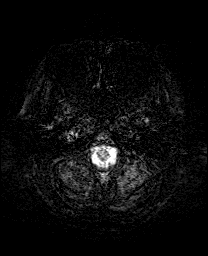
[im 45/45]
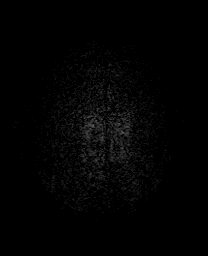

[Series 17: T2 · coronal · 5.0mm · 0.34mm/px · 2 of 29 slices shown (2 of 2)]
[im 1/29]
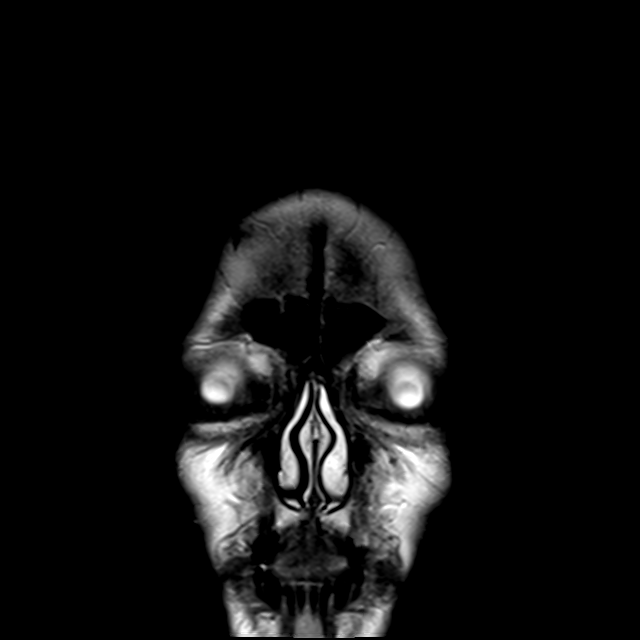
[im 29/29]
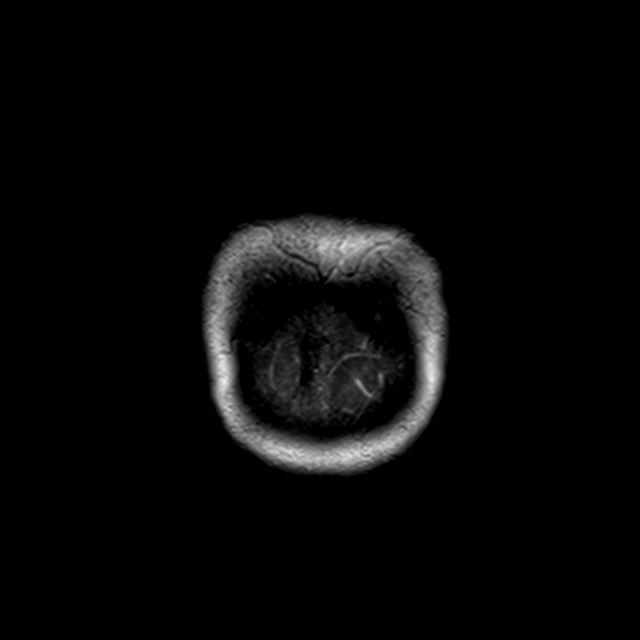

[Series 23: tof_fl3d_tra_iso · axial · 0.6mm · 0.52mm/px · z∈[-226,-147]mm · 7 of 133 slices shown]
[im 1/133]
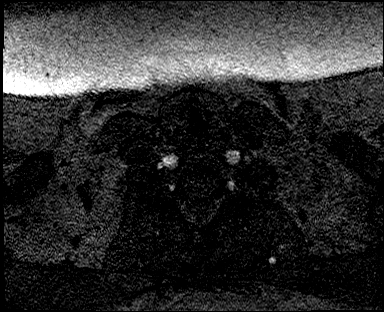
[im 23/133]
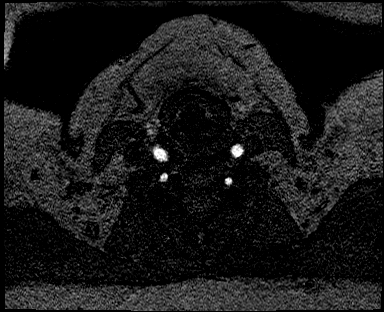
[im 45/133]
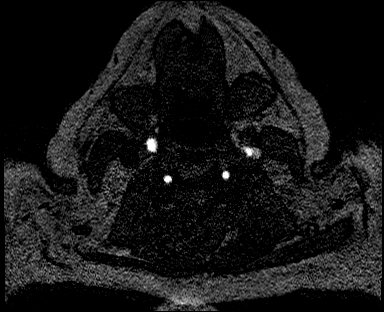
[im 67/133]
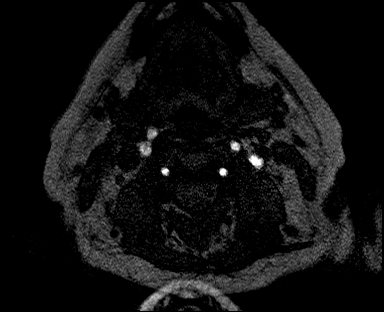
[im 89/133]
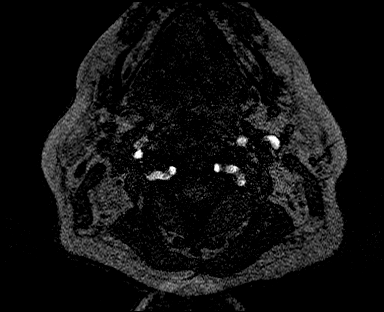
[im 111/133]
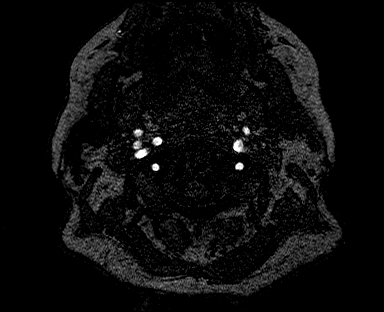
[im 133/133]
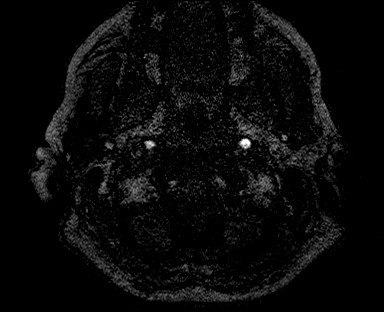

[Series 26: tof_fl2d_tra · axial · 3.0mm · 0.78mm/px · z∈[-321,-115]mm · 5 of 100 slices shown]
[im 1/100]
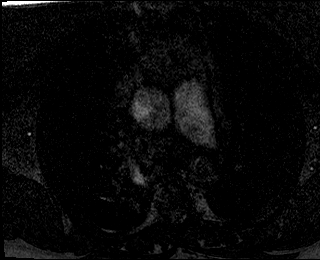
[im 25/100]
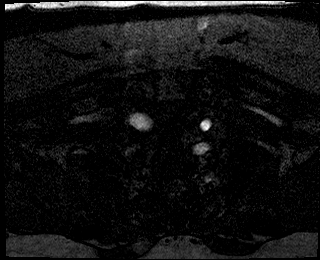
[im 50/100]
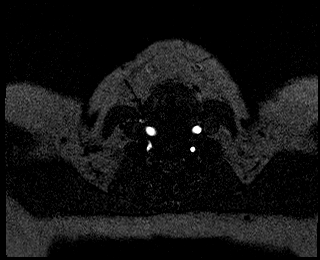
[im 75/100]
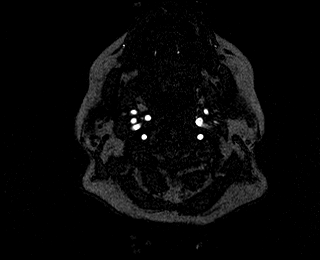
[im 100/100]
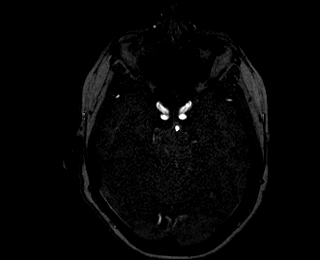

[Series 29: tof_fl2d_tra_mip_tra · axial · 210.9mm · 0.78mm/px · 1 of 1 slices shown]
[im 1/1]
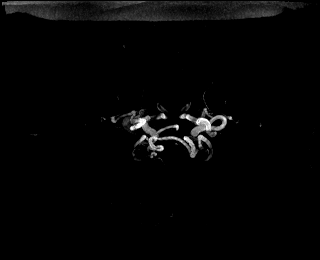

[Series 30: tof_fl2d_tra_mip_radials · coronal · 0.78mm/px · 1 of 7 slices shown]
[im 1/7]
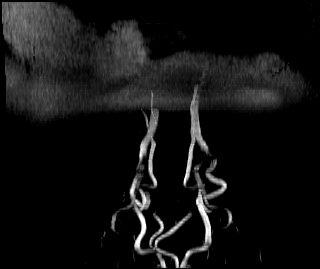

[42 of 48 positions shown; findings below may reference images not displayed]

FINDINGS: MRI HEAD

Brain: There is no acute infarction or intracranial hemorrhage.
There is no intracranial mass, mass effect, or edema. There is no
hydrocephalus or extra-axial fluid collection. Ventricles and sulci
are within normal limits in size and configuration. Patchy foci of
T2 hyperintensity in the supratentorial white matter are nonspecific
but may reflect mild chronic microvascular ischemic changes.

Vascular: T1 hyperintensity of the visualized left internal carotid
artery apart from the distal supraclinoid portion likely reflects
slow flow with preservation of flow void on T2.

Skull and upper cervical spine: Normal marrow signal is preserved.

Sinuses/Orbits: Minor mucosal thickening.  Orbits are unremarkable.

Other: Sella is partially empty. Trace right mastoid fluid
opacification.

MRA HEAD

Intracranial internal carotid arteries are patent. Middle and
anterior cerebral arteries are patent. Mild narrowing of the distal
M1 MCA is bilaterally. Intracranial vertebral arteries, basilar
artery, posterior cerebral arteries are patent. There is no
aneurysm.

MRA NECK

Common, internal, and external carotid arteries are patent. There is
plaque at the ICA origins bilaterally. Approximately 50% stenosis is
present on the right and less than 50% stenosis is present on the
left.

Extracranial vertebral arteries are patent. Origins are not well
evaluated due to artifact. There is duplication of the proximal
right vertebral artery. Codominant vertebrals.
IMPRESSION: No acute infarction, hemorrhage, or mass. Mild chronic microvascular
ischemic changes.

No large vessel occlusion.

Plaque at the right ICA origin causes approximately 50% stenosis.
Plaque at the left ICA origin causes less than 50% stenosis.

## 2020-11-28 IMAGING — MR MR HEAD W/O CM
12 of 14 series · 35 of 48 positions shown · non-contrast
Comparison: None.

CLINICAL DATA: Abnormal mental status



[Series 5: DWI · axial · 3.0mm · 0.88mm/px · z∈[-144,+9]mm · 7 of 104 slices shown (1 of 4)]
[im 1/104]
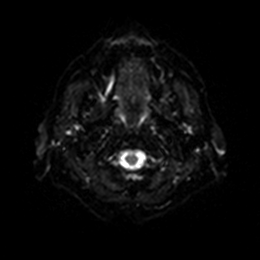
[im 18/104]
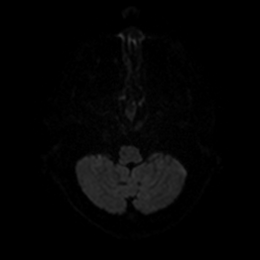
[im 35/104]
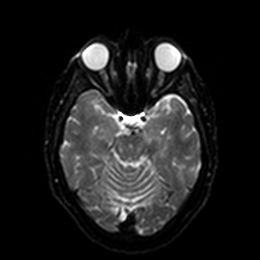
[im 52/104]
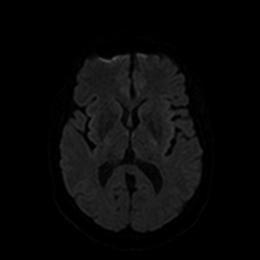
[im 69/104]
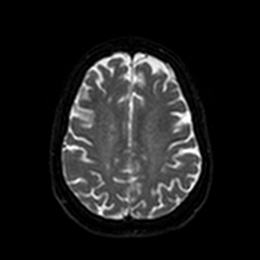
[im 86/104]
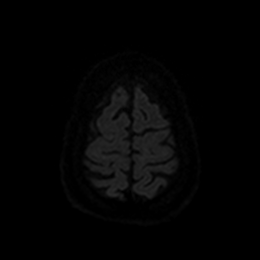
[im 104/104]
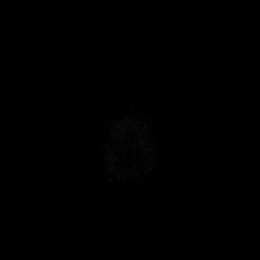

[Series 6: DWI · axial · 3.0mm · 0.88mm/px · z∈[-144,+9]mm · 3 of 52 slices shown (2 of 4)]
[im 1/52]
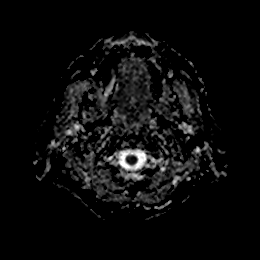
[im 26/52]
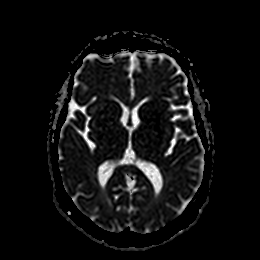
[im 52/52]
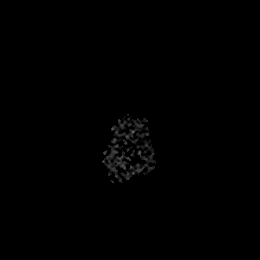

[Series 7: DWI · coronal · 4.0mm · 0.88mm/px · 4 of 68 slices shown (3 of 4)]
[im 1/68]
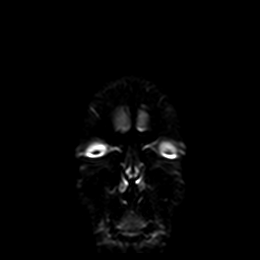
[im 23/68]
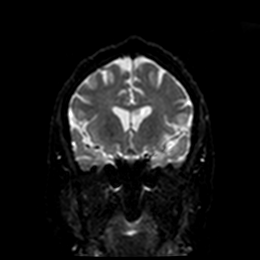
[im 45/68]
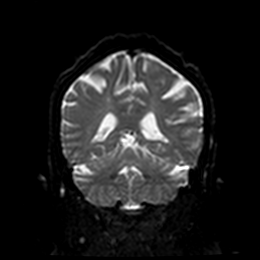
[im 68/68]
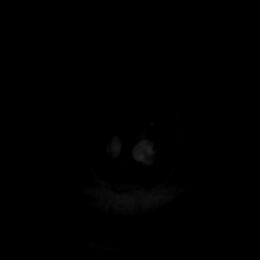

[Series 8: DWI · coronal · 4.0mm · 0.88mm/px · 2 of 34 slices shown (4 of 4)]
[im 1/34]
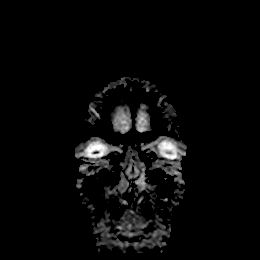
[im 34/34]
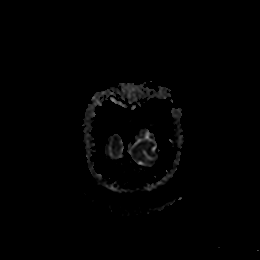

[Series 13: T1 · sagittal · 5.0mm · 0.75mm/px · 1 of 23 slices shown]
[im 1/23]
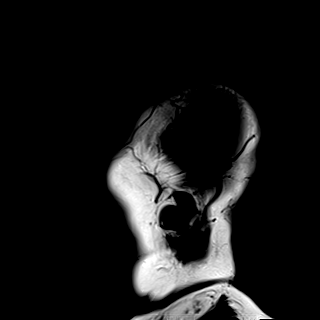

[Series 14: T2 · axial · 5.0mm · 0.72mm/px · z∈[-145,+10]mm · 2 of 27 slices shown (1 of 2)]
[im 1/27]
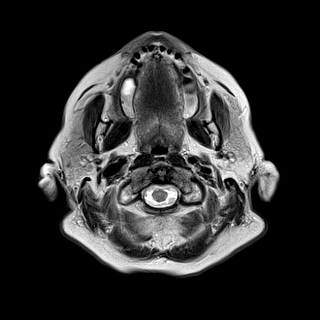
[im 27/27]
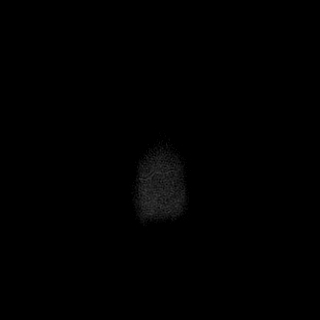

[Series 15: FLAIR · axial · 5.0mm · 0.45mm/px · z∈[-146,+10]mm · 2 of 27 slices shown]
[im 1/27]
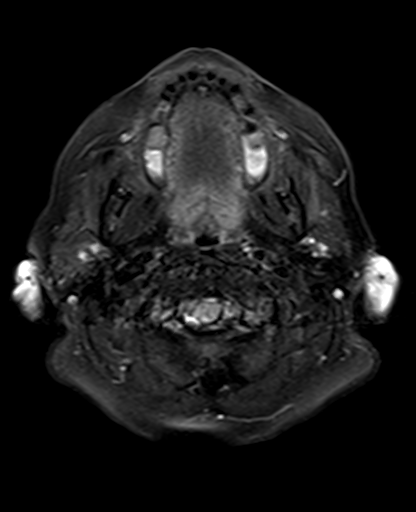
[im 27/27]
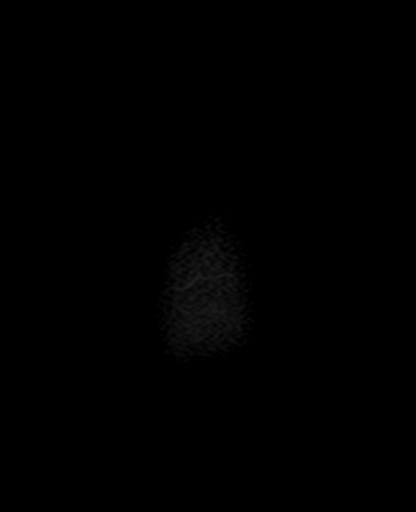

[Series 16: mag_images · axial · 3.0mm · 0.90mm/px · z∈[-144,+9]mm · 3 of 52 slices shown]
[im 1/52]
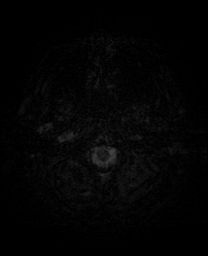
[im 26/52]
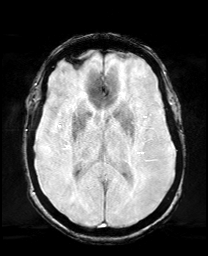
[im 52/52]
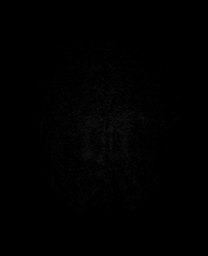

[Series 17: pha_images · axial · 3.0mm · 0.90mm/px · z∈[-144,+6]mm · 3 of 51 slices shown]
[im 1/51]
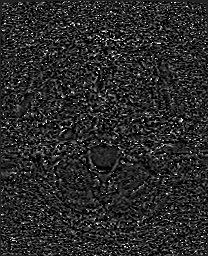
[im 26/51]
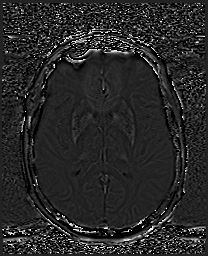
[im 51/51]
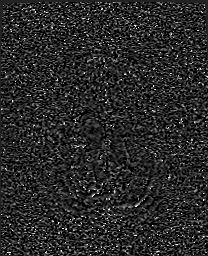

[Series 18: swi_images · axial · 3.0mm · 0.90mm/px · z∈[-144,+9]mm · 3 of 52 slices shown]
[im 1/52]
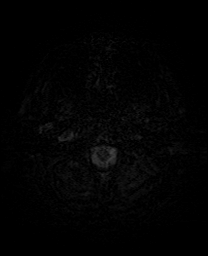
[im 26/52]
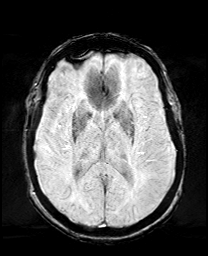
[im 52/52]
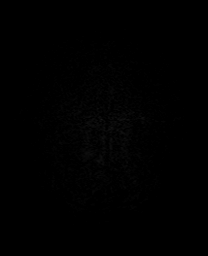

[Series 19: mip_images(sw) · axial · 24.0mm · 0.90mm/px · z∈[-134,-2]mm · 3 of 45 slices shown]
[im 1/45]
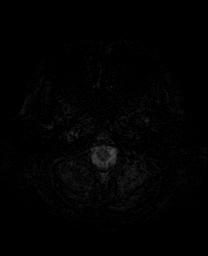
[im 23/45]
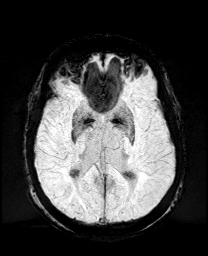
[im 45/45]
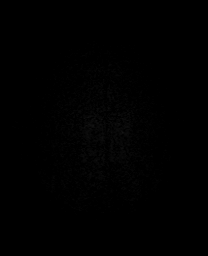

[Series 21: T2 · coronal · 5.0mm · 0.34mm/px · 2 of 29 slices shown (2 of 2)]
[im 1/29]
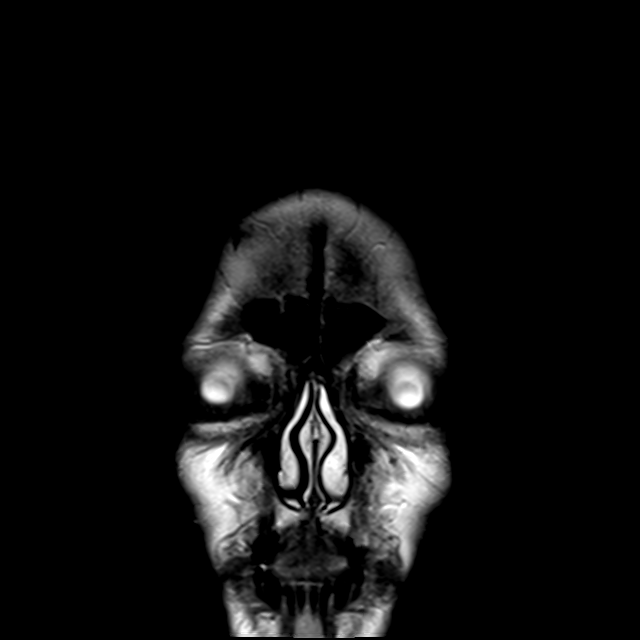
[im 29/29]
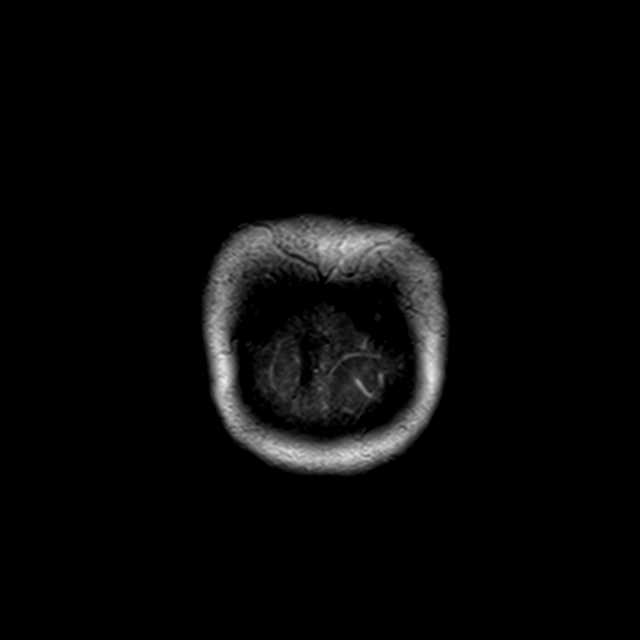

[35 of 48 positions shown; findings below may reference images not displayed]

FINDINGS: MRI HEAD

Brain: There is no acute infarction or intracranial hemorrhage.
There is no intracranial mass, mass effect, or edema. There is no
hydrocephalus or extra-axial fluid collection. Ventricles and sulci
are within normal limits in size and configuration. Patchy foci of
T2 hyperintensity in the supratentorial white matter are nonspecific
but may reflect mild chronic microvascular ischemic changes.

Vascular: T1 hyperintensity of the visualized left internal carotid
artery apart from the distal supraclinoid portion likely reflects
slow flow with preservation of flow void on T2.

Skull and upper cervical spine: Normal marrow signal is preserved.

Sinuses/Orbits: Minor mucosal thickening.  Orbits are unremarkable.

Other: Sella is partially empty. Trace right mastoid fluid
opacification.

MRA HEAD

Intracranial internal carotid arteries are patent. Middle and
anterior cerebral arteries are patent. Mild narrowing of the distal
M1 MCA is bilaterally. Intracranial vertebral arteries, basilar
artery, posterior cerebral arteries are patent. There is no
aneurysm.

MRA NECK

Common, internal, and external carotid arteries are patent. There is
plaque at the ICA origins bilaterally. Approximately 50% stenosis is
present on the right and less than 50% stenosis is present on the
left.

Extracranial vertebral arteries are patent. Origins are not well
evaluated due to artifact. There is duplication of the proximal
right vertebral artery. Codominant vertebrals.
IMPRESSION: No acute infarction, hemorrhage, or mass. Mild chronic microvascular
ischemic changes.

No large vessel occlusion.

Plaque at the right ICA origin causes approximately 50% stenosis.
Plaque at the left ICA origin causes less than 50% stenosis.

## 2020-11-28 IMAGING — MR MR MRA HEAD W/O CM
12 of 13 series · 44 of 48 positions shown · non-contrast
Comparison: None.

CLINICAL DATA: Abnormal mental status



[Series 5: DWI · axial · 3.0mm · 0.88mm/px · z∈[-144,+9]mm · 8 of 104 slices shown (1 of 4)]
[im 1/104]
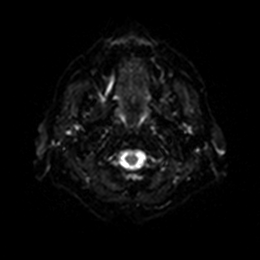
[im 15/104]
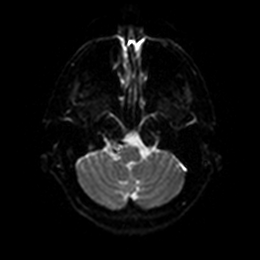
[im 30/104]
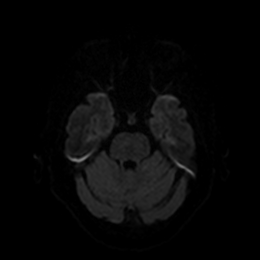
[im 45/104]
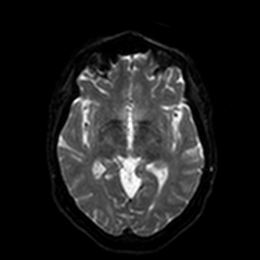
[im 59/104]
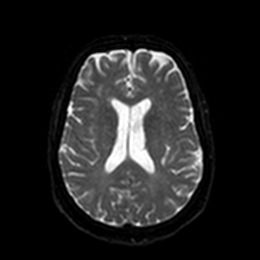
[im 74/104]
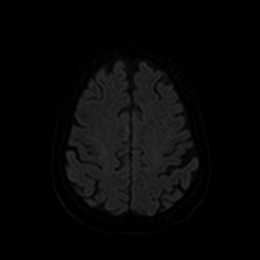
[im 89/104]
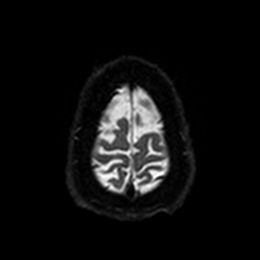
[im 104/104]
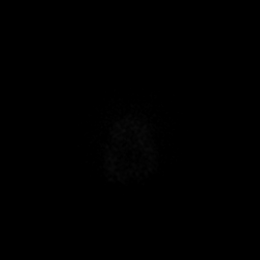

[Series 6: DWI · axial · 3.0mm · 0.88mm/px · z∈[-144,+9]mm · 4 of 52 slices shown (2 of 4)]
[im 1/52]
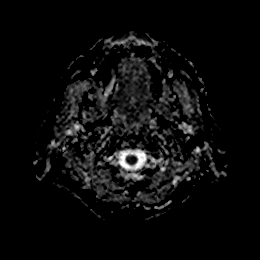
[im 18/52]
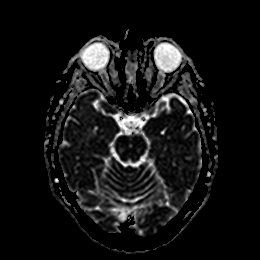
[im 35/52]
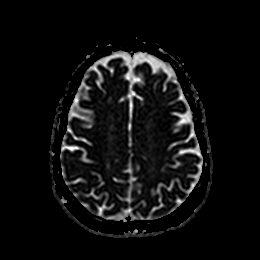
[im 52/52]
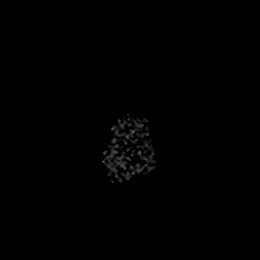

[Series 7: DWI · coronal · 4.0mm · 0.88mm/px · 5 of 68 slices shown (3 of 4)]
[im 1/68]
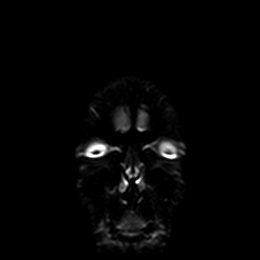
[im 17/68]
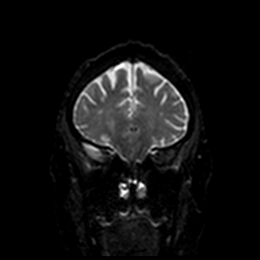
[im 34/68]
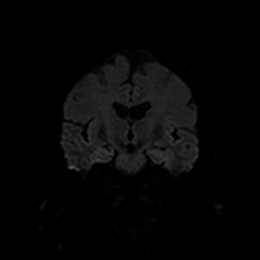
[im 51/68]
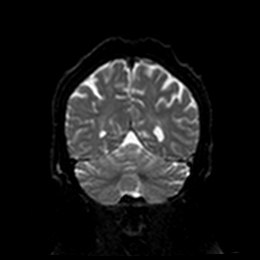
[im 68/68]
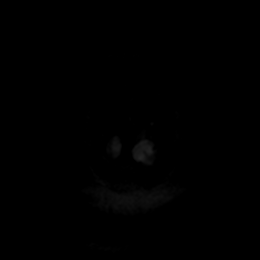

[Series 8: DWI · coronal · 4.0mm · 0.88mm/px · 3 of 34 slices shown (4 of 4)]
[im 1/34]
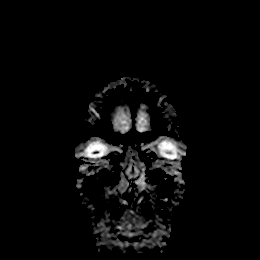
[im 17/34]
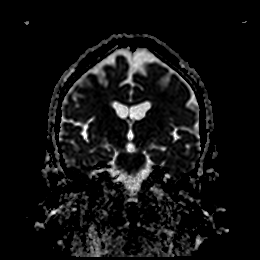
[im 34/34]
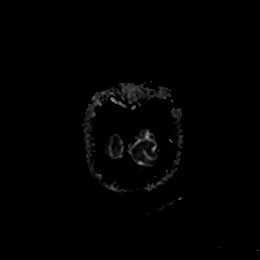

[Series 9: T1 · sagittal · 5.0mm · 0.75mm/px · 2 of 23 slices shown]
[im 1/23]
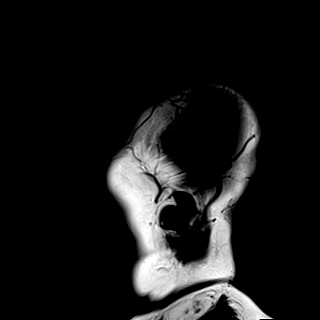
[im 23/23]
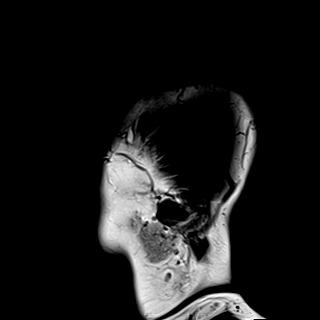

[Series 10: T2 · axial · 5.0mm · 0.72mm/px · z∈[-145,+10]mm · 2 of 27 slices shown (1 of 2)]
[im 1/27]
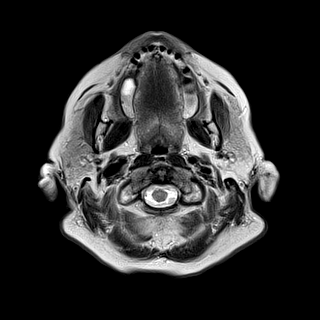
[im 27/27]
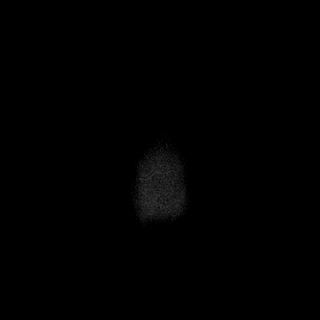

[Series 11: FLAIR · axial · 5.0mm · 0.45mm/px · z∈[-146,+10]mm · 2 of 27 slices shown]
[im 1/27]
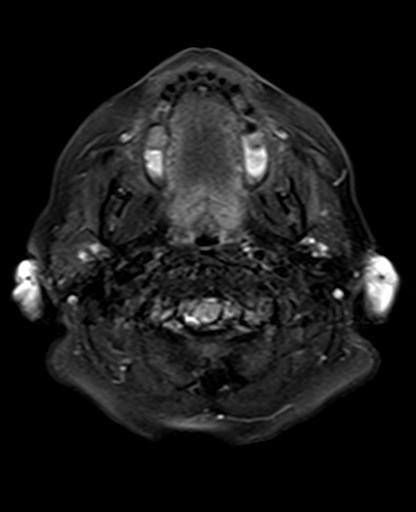
[im 27/27]
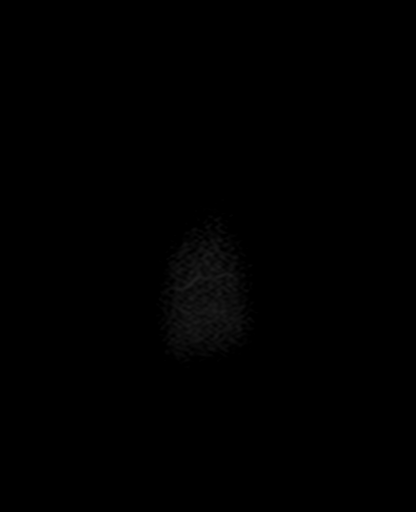

[Series 12: mag_images · axial · 3.0mm · 0.90mm/px · z∈[-144,+9]mm · 4 of 52 slices shown]
[im 1/52]
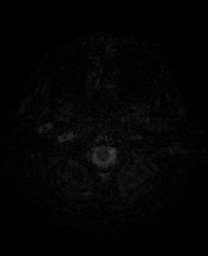
[im 18/52]
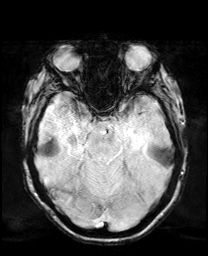
[im 35/52]
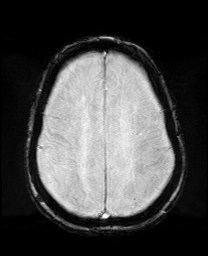
[im 52/52]
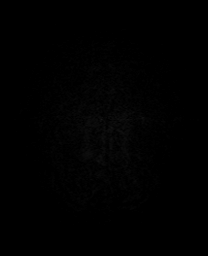

[Series 13: pha_images · axial · 3.0mm · 0.90mm/px · z∈[-144,+6]mm · 4 of 51 slices shown]
[im 1/51]
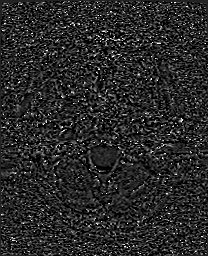
[im 17/51]
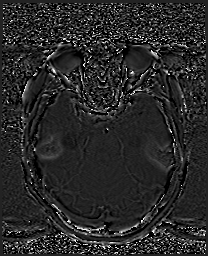
[im 34/51]
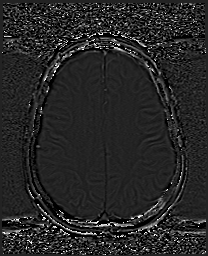
[im 51/51]
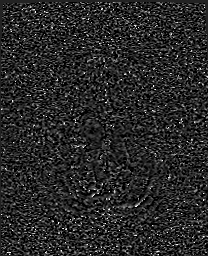

[Series 14: swi_images · axial · 3.0mm · 0.90mm/px · z∈[-144,+9]mm · 4 of 52 slices shown]
[im 1/52]
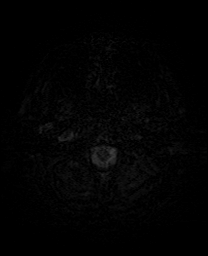
[im 18/52]
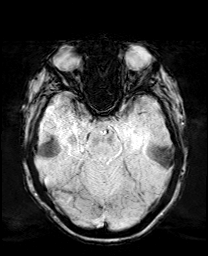
[im 35/52]
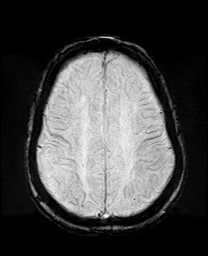
[im 52/52]
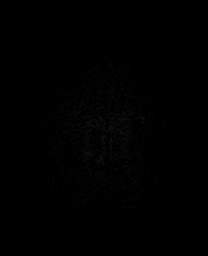

[Series 15: mip_images(sw) · axial · 24.0mm · 0.90mm/px · z∈[-134,-2]mm · 4 of 45 slices shown]
[im 1/45]
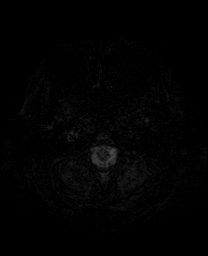
[im 15/45]
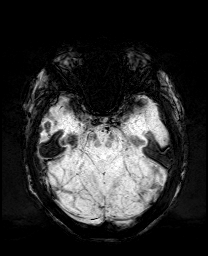
[im 30/45]
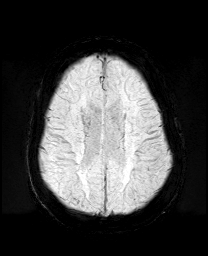
[im 45/45]
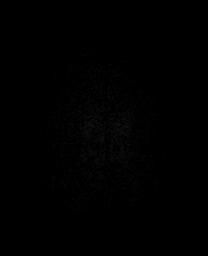

[Series 17: T2 · coronal · 5.0mm · 0.34mm/px · 2 of 29 slices shown (2 of 2)]
[im 1/29]
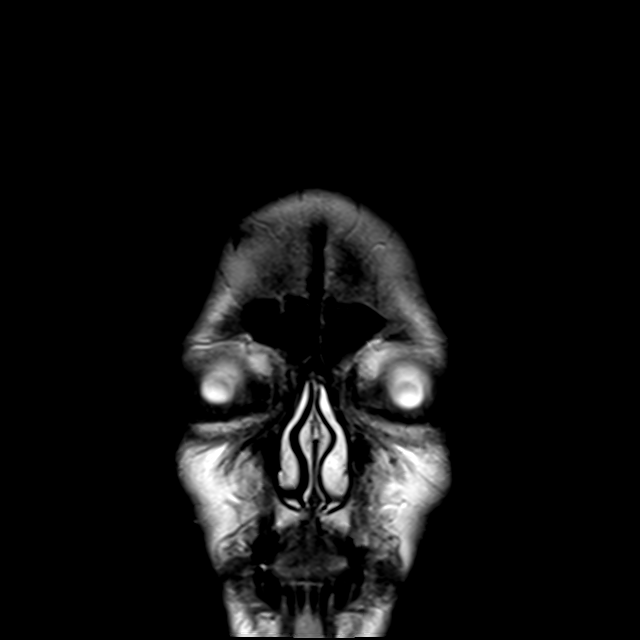
[im 29/29]
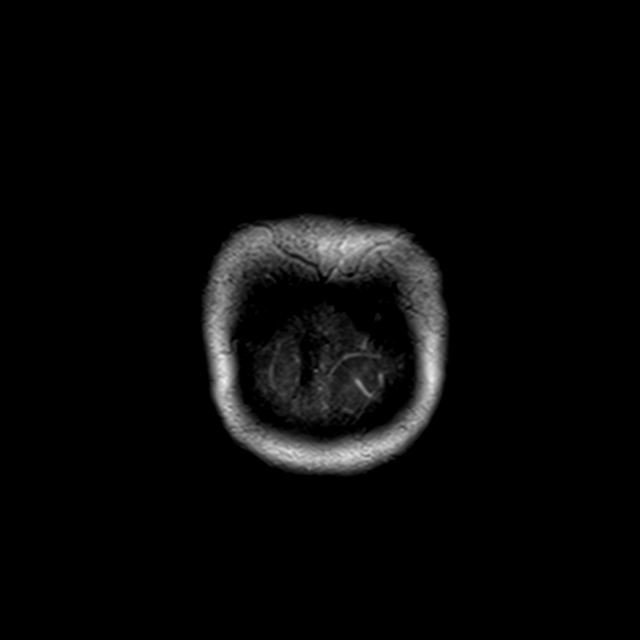

[44 of 48 positions shown; findings below may reference images not displayed]

FINDINGS: MRI HEAD

Brain: There is no acute infarction or intracranial hemorrhage.
There is no intracranial mass, mass effect, or edema. There is no
hydrocephalus or extra-axial fluid collection. Ventricles and sulci
are within normal limits in size and configuration. Patchy foci of
T2 hyperintensity in the supratentorial white matter are nonspecific
but may reflect mild chronic microvascular ischemic changes.

Vascular: T1 hyperintensity of the visualized left internal carotid
artery apart from the distal supraclinoid portion likely reflects
slow flow with preservation of flow void on T2.

Skull and upper cervical spine: Normal marrow signal is preserved.

Sinuses/Orbits: Minor mucosal thickening.  Orbits are unremarkable.

Other: Sella is partially empty. Trace right mastoid fluid
opacification.

MRA HEAD

Intracranial internal carotid arteries are patent. Middle and
anterior cerebral arteries are patent. Mild narrowing of the distal
M1 MCA is bilaterally. Intracranial vertebral arteries, basilar
artery, posterior cerebral arteries are patent. There is no
aneurysm.

MRA NECK

Common, internal, and external carotid arteries are patent. There is
plaque at the ICA origins bilaterally. Approximately 50% stenosis is
present on the right and less than 50% stenosis is present on the
left.

Extracranial vertebral arteries are patent. Origins are not well
evaluated due to artifact. There is duplication of the proximal
right vertebral artery. Codominant vertebrals.
IMPRESSION: No acute infarction, hemorrhage, or mass. Mild chronic microvascular
ischemic changes.

No large vessel occlusion.

Plaque at the right ICA origin causes approximately 50% stenosis.
Plaque at the left ICA origin causes less than 50% stenosis.

## 2020-11-28 MED ORDER — ATORVASTATIN CALCIUM 40 MG PO TABS
40.0000 mg | ORAL_TABLET | Freq: Every day | ORAL | Status: DC
  Administered 2020-11-28: 40 mg via ORAL
  Filled 2020-11-28: qty 1

## 2020-11-28 MED ORDER — INSULIN ASPART 100 UNIT/ML ~~LOC~~ SOLN: 0.0000 [IU] | Freq: Three times a day (TID) | SUBCUTANEOUS | Status: DC

## 2020-11-28 MED ORDER — POLYVINYL ALCOHOL 1.4 % OP SOLN: 1.0000 [drp] | Freq: Every day | OPHTHALMIC | Status: DC | PRN

## 2020-11-28 MED ORDER — ESCITALOPRAM OXALATE 10 MG PO TABS
10.0000 mg | ORAL_TABLET | Freq: Every day | ORAL | Status: DC
  Administered 2020-11-29: 10 mg via ORAL
  Filled 2020-11-28 (×2): qty 1

## 2020-11-28 MED ORDER — CLOPIDOGREL BISULFATE 75 MG PO TABS
75.0000 mg | ORAL_TABLET | Freq: Every day | ORAL | Status: DC
  Administered 2020-11-28 – 2020-11-29 (×2): 75 mg via ORAL
  Filled 2020-11-28 (×2): qty 1

## 2020-11-28 MED ORDER — ENOXAPARIN SODIUM 40 MG/0.4ML ~~LOC~~ SOLN
40.0000 mg | SUBCUTANEOUS | Status: DC
  Administered 2020-11-28: 40 mg via SUBCUTANEOUS

## 2020-11-28 MED ORDER — STROKE: EARLY STAGES OF RECOVERY BOOK
Freq: Once | Status: AC
  Filled 2020-11-28: qty 1

## 2020-11-28 MED ORDER — ASPIRIN 81 MG PO CHEW
81.0000 mg | CHEWABLE_TABLET | Freq: Every day | ORAL | Status: DC
  Administered 2020-11-28 – 2020-11-29 (×2): 81 mg via ORAL
  Filled 2020-11-28 (×2): qty 1

## 2020-11-28 NOTE — Progress Notes (Signed)
11/28/20 1707  PT Visit Information  Last PT Received On 11/28/20  Assistance Needed +1  PT/OT/SLP Co-Evaluation/Treatment Yes  Reason for Co-Treatment For patient/therapist safety  PT goals addressed during session Balance;Mobility/safety with mobility  History of Present Illness This 65 y.o. female presented to ED with heaviness/tingling Lt side of body followed by syncopal type of event. She was found to be extremely hypotensive 95/35.  CT and MRI/MRA showed no acute infarct, but bil. ICA stenosis.  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  Communication  Communication No difficulties  Pain Assessment  Pain Assessment Faces  Faces Pain Scale 4  Pain Location headache  Pain Descriptors / Indicators Headache  Pain Intervention(s) Limited activity within patient's tolerance;Monitored during session;Repositioned  Cognition  Arousal/Alertness Awake/alert  Behavior During Therapy WFL for tasks assessed/performed  Overall Cognitive Status Within Functional Limits for tasks assessed  Upper Extremity Assessment  Upper Extremity Assessment Defer to OT evaluation  Lower Extremity Assessment  Lower Extremity Assessment Generalized weakness (Inconsistencies noted between MMT and functional mobility in LLE)  Cervical / Trunk Assessment  Cervical / Trunk Assessment Normal  Bed Mobility  Overal  bed mobility Needs Assistance  Bed Mobility Supine to Sit;Sit to Supine  Supine to sit Min assist;+2 for safety/equipment  Sit to supine Mod assist;+2 for physical assistance;+2 for safety/equipment  General bed mobility comments pt with symptomatic orthostatic hypotension and was assisted with return to supine. Min A for trunk elevation to come to sitting.  Transfers  Overall transfer level Needs assistance  Equipment used 2 person hand held assist  Transfers Sit to/from Stand  Sit to Stand Min assist;+2 physical assistance;+2 safety/equipment  General transfer comment requires assist for safety and balance moving from sit to stand from ED stretcher - pt 4'11". Pt with symptomatic orthostatic hypotension, so returned to sitting.  Balance  Overall balance assessment Needs assistance  Sitting-balance support Feet unsupported  Sitting balance-Leahy Scale Fair  Standing balance support Bilateral upper extremity supported  Standing balance-Leahy Scale Poor  Standing balance comment requires UE support  PT - End of Session  Equipment Utilized During Treatment Gait belt  Activity Tolerance Treatment limited secondary to medical complications (Comment) (+ orthostatics)  Patient left in bed;with call bell/phone within reach;with family/visitor present (on stretcher in ED)  Nurse Communication Mobility status (+ orthostatics)  PT Assessment  PT Recommendation/Assessment Patient needs continued PT services  PT Visit Diagnosis Unsteadiness on feet (R26.81)  PT Problem List Decreased strength;Decreased balance;Decreased mobility;Decreased activity tolerance;Cardiopulmonary status limiting activity  PT Plan  PT Frequency (ACUTE ONLY) Min 3X/week  PT Treatment/Interventions (ACUTE ONLY) DME instruction;Gait training;Therapeutic activities;Functional mobility training;Balance training;Therapeutic exercise;Patient/family education  AM-PAC PT "6 Clicks" Mobility Outcome Measure (Version 2)  Help  needed turning from your back to your side while in a flat bed without using bedrails? 3  Help needed moving from lying on your back to sitting on the side of a  flat bed without using bedrails? 3  Help needed moving to and from a bed to a chair (including a wheelchair)? 3  Help needed standing up from a chair using your arms (e.g., wheelchair or bedside chair)? 3  Help needed to walk in hospital room? 3  Help needed climbing 3-5 steps with a railing?  3  6 Click Score 18  Consider Recommendation of Discharge To: Home with [facility]  PT Recommendation  Follow Up Recommendations  (HHPT vs No PT follow up pending progression)  PT equipment None recommended by PT  Individuals Consulted  Consulted and Agree with Results and Recommendations Patient  Acute Rehab PT Goals  Patient Stated Goal did not state  PT Goal Formulation With patient  Time For Goal Achievement 12/12/20  Potential to Achieve Goals Good  PT Time Calculation  PT Start Time (ACUTE ONLY) 1435  PT Stop Time (ACUTE ONLY) 1458  PT Time Calculation (min) (ACUTE ONLY) 23 min  PT General Charges  $$ ACUTE PT VISIT 1 Visit  PT Evaluation  $PT Eval Moderate Complexity 1 Mod  Written Expression  Dominant Hand Left    Pt admitted secondary to problem above with deficits above. Pt limited this session secondary to + orthostatics; was symptomatic in standing. Pt requiring min A +2 for safety to stand from elevated bed height. Anticipate pt will progress well once BP controlled. Will continue to follow acutely to maximize functional mobility independence and safety.   Farley Ly, PT, DPT  Acute Rehabilitation Services  Pager: [phone] Office: [phone]    11/28/20 1442  Orthostatic Lying   BP- Lying 160/70  Pulse- Lying 73  Orthostatic Sitting  BP- Sitting 173/68  Pulse- Sitting 87  Orthostatic Standing at 0 minutes  BP- Standing at 0 minutes 119/65  Pulse- Standing at 0 minutes 86

## 2020-11-28 NOTE — ED Notes (Signed)
Back to rooom at this time.

## 2020-11-28 NOTE — Progress Notes (Signed)
PROGRESS NOTE    Sydney Baird  ZOX:[id number] DOB: 09/02/1956 DOA: 11/27/2020 PCP: Patient, No Pcp Per   Brief Narrative:  HPI On 11/28/2020 by Dr. Shauna Hugh 65 year old female with past medical history of hyperlipidemia, obstructive sleep apnea on CPAP, bilateral carotid stenosis, hypertension, migraine headaches, diabetes mellitus type 2, anxiety disorder presents to [facility] emergency room via EMS after experiencing a prolonged episode of loss of consciousness and hypotension.  Patient explains that she and her family went to [employer] the evening of 3/2 for dinner.  Before sitting down to eat, patient began to feel a heaviness and tingling of the left side of her body.  Shortly after the patient sat down and began to eat some bread she felt a sensation of intense lightheadedness.  Shortly thereafter, she states that she entered a state of consciousness where she could not tell if she was a fully awake or asleep.  She did hear muffled sounds of her family calling out to her touching her but she could not willingly open her eyes or control her body.  She knew that she had completely lost consciousness at some point but is unaware of the sequence of events or timeline.  Shortly after this episode began, patient felt her body being jostled around and she was placed in an EMS transportation truck and she could hear the voices of the EMS personnel.  She still did not help control of herself nor could she wake up.  Was not until the patient arrived in [facility] emergency department the patient finally began to wake up.  According to my discussion with emergency department staff, the patient was extremely lethargic on arrival and unable to adequately participate in conversation with the care staff.  Per review of EMS notes, patient was found to be profoundly hypotensive while still the restaurant with blood pressures as low as 50/30.  There was a short while  were obtaining a pulse was extremely difficult but CPR was not administered.  Patient was transiently placed on epinephrine infusion until blood pressures came up and was then transitioned off.  Upon evaluation in the emergency department CT head was found to be unremarkable.  Chest x-ray revealed no evidence of infection.  Patient was found to be somewhat hyponatremic with a sodium of 131.  Patient was hydrated with intravenous isotonic fluids.  No benzodiazepines or antiepileptics were administered.  Patient was initially found to be hypotensive on arrival with blood pressure of 95/35 which quickly resolved after administration of intravenous fluids.  The hospitalist group was then called to assess the patient for admission to the hospital.  Of note, patient continues to complain of left-sided tingling and dullness of the left upper and left lower extremities. Assessment & Plan   Admitted earlier today by Dr. Leafy Half. See H&P for details.   Loss of consciousness-possible syncope versus TIA -Patient experienced left-sided tingling followed by an altered state of consciousness and hypotension for period of time with confusion. -Patient daily she still has some left upper extremity numbness and weakness. -CT head unremarkable -MRI/MRA brain/head neck: No acute infarction, hemorrhage or mass.  No large vessel occlusion.  Bilateral ICA stenosis 50%. -EEG study within normal limits, no seizure or left form discharges seen throughout recording. -Currently on aspirin, Plavix, statin -Pending echocardiogram and orthostatic vitals -Pending PT and OT evaluations -Discussed with neurology, suspect possible syncopal episode with bilateral artery stenosis causing patient's issues.  MRI and CT were negative for CVA, if  this was a TIA, patient is currently on appropriate treatment. -Patient states that she follows up with a neurologist as an outpatient, Dr. Royston Sinner with [facility]  Bilateral carotid  artery stenosis -Patient with known history of bilateral carotid artery stenosis with 70% lesion on the right, 50% lesion on the left. -MRA head and neck as above -Continue statin, plavix, aspirin  Hypotension -Transient -BP currently stable -Antihypertensive medications currently held -pending orthostatic vitals  Hyperlipidemia -continue statin -Lipid panel showed total cholesterol 139, HDL 34, LDL 113, triglycerides 162  Obstructive sleep apnea -Continue CPAP  Diabetes mellitus, type II -Hemoglobin A1c 6.5 -continue Insulin sliding scale CBG monitoring  Generalized anxiety disorder -Continue Lexapro  DVT Prophylaxis  lovenox  Code Status: Full  Family Communication: None at bedside  Disposition Plan:  Status is: Observation  The patient remains OBS appropriate and will d/c before 2 midnights.  Dispo: The patient is from: Home              Anticipated d/c is to: Home              Patient currently is not medically stable to d/c.   Difficult to place patient No   Consultants Neurology, via phone  Procedures  None  Antibiotics   Anti-infectives (From admission, onward)   None      Subjective:   Sydney Baird seen and examined today.  Continues to complain of weakness in her left arm as well as some numbness in the left forearm.  Currently denies chest pain or shortness of breath, abdominal pain, nausea or vomiting, diarrhea or constipation, dizziness or headache.  Objective:   Vitals:   11/28/20 0630 11/28/20 0645 11/28/20 1015 11/28/20 1100  BP: (!) 150/59 137/65 (!) 151/67 (!) 167/83  Pulse: (!) 59 66 64 64  Resp: 20 18 (!) 22 13  Temp:      TempSrc:      SpO2: 98% 97% 98% 99%  Weight:      Height:        Intake/Output Summary (Last 24 hours) at 11/28/2020 1329 Last data filed at 11/27/2020 1558 Gross per 24 hour  Intake 500 ml  Output --  Net 500 ml   Filed Weights   11/27/20 1629  Weight: 90.7 kg    Exam  General: Well developed,  well nourished, NAD, appears stated age  HEENT: NCAT, mucous membranes moist.   Cardiovascular: S1 S2 auscultated, RRR  Respiratory: Clear to auscultation bilaterally with equal chest rise, no wheezing  Abdomen: Soft, nontender, nondistended, + bowel sounds  Extremities: warm dry without cyanosis clubbing or edema  Neuro: AAOx3, Strength LUE 4/5 compared to right, otherwise nonfocal  Skin: Without rashes exudates or nodules  Psych: mildly anxious, however appropriate   Data Reviewed: I have personally reviewed following labs and imaging studies  CBC: Recent Labs  Lab 11/27/20 1600 11/28/20 0323  WBC 8.3 8.8  NEUTROABS 5.1  --   HGB 13.0 11.5*  HCT 38.7 33.8*  MCV 88.2 89.9  PLT 411* 338   Basic Metabolic Panel: Recent Labs  Lab 11/27/20 1600 11/28/20 0323  NA 131* 131*  K 3.8 4.2  CL 99 103  CO2 19* 18*  GLUCOSE 131* 104*  BUN 10 8  CREATININE 0.88 0.69  CALCIUM 9.2 8.2*   GFR: Estimated Creatinine Clearance: 70.7 mL/min (by C-G formula based on SCr of 0.69 mg/dL). Liver Function Tests: Recent Labs  Lab 11/27/20 1600 11/28/20 0323  AST 19 16  ALT  15 14  ALKPHOS 37* 31*  BILITOT 0.8 0.7  PROT 6.6 5.5*  ALBUMIN 3.5 2.9*   No results for input(s): LIPASE, AMYLASE in the last 168 hours. No results for input(s): AMMONIA in the last 168 hours. Coagulation Profile: No results for input(s): INR, PROTIME in the last 168 hours. Cardiac Enzymes: No results for input(s): CKTOTAL, CKMB, CKMBINDEX, TROPONINI in the last 168 hours. BNP (last 3 results) No results for input(s): PROBNP in the last 8760 hours. HbA1C: Recent Labs    11/28/20 0323  HGBA1C 6.5*   CBG: Recent Labs  Lab 11/27/20 1550 11/28/20 0816 11/28/20 1203  GLUCAP 126* 104* 99   Lipid Profile: Recent Labs    11/28/20 0323  CHOL 179  HDL 34*  LDLCALC 113*  TRIG 162*  CHOLHDL 5.3   Thyroid Function Tests: No results for input(s): TSH, T4TOTAL, FREET4, T3FREE, THYROIDAB in the  last 72 hours. Anemia Panel: No results for input(s): VITAMINB12, FOLATE, FERRITIN, TIBC, IRON, RETICCTPCT in the last 72 hours. Urine analysis: No results found for: COLORURINE, APPEARANCEUR, LABSPEC, PHURINE, GLUCOSEU, HGBUR, BILIRUBINUR, KETONESUR, PROTEINUR, UROBILINOGEN, NITRITE, LEUKOCYTESUR Sepsis Labs: (procalcitonin:4,lacticidven:4)  ) Recent Results (from the past 240 hour(s))  SARS CORONAVIRUS 2 (TAT 6-24 HRS) Nasopharyngeal Nasopharyngeal Swab     Status: None   Collection Time: 11/27/20  9:34 PM   Specimen: Nasopharyngeal Swab  Result Value Ref Range Status   SARS Coronavirus 2 NEGATIVE NEGATIVE Final    Comment: (NOTE) SARS-CoV-2 target nucleic acids are NOT DETECTED.  The SARS-CoV-2 RNA is generally detectable in upper and lower respiratory specimens during the acute phase of infection. Negative results do not preclude SARS-CoV-2 infection, do not rule out co-infections with other pathogens, and should not be used as the sole basis for treatment or other patient management decisions. Negative results must be combined with clinical observations, patient history, and epidemiological information. The expected result is Negative.  Fact Sheet for Patients: HairSlick.no  Fact Sheet for Healthcare Providers: quierodirigir.com  This test is not yet approved or cleared by the [place] FDA and  has been authorized for detection and/or diagnosis of SARS-CoV-2 by FDA under an Emergency Use Authorization (EUA). This EUA will remain  in effect (meaning this test can be used) for the duration of the COVID-19 declaration under Se ction 564(b)(1) of the Act, 21 U.S.C. section 360bbb-3(b)(1), unless the authorization is terminated or revoked sooner.  Performed at [facility], [address] [place], [place] [address]       Radiology Studies: CT HEAD WO CONTRAST  Result Date: 11/27/2020 CLINICAL  DATA:  Mental status change, unknown cause EXAM: CT HEAD WITHOUT CONTRAST TECHNIQUE: Contiguous axial images were obtained from the base of the skull through the vertex without intravenous contrast. COMPARISON:  None. FINDINGS: Study is degraded by beam hardening artifact from metallic densities external to the scalp along the posterior calvarium. Brain: No evidence of acute infarction, hemorrhage, hydrocephalus, extra-axial collection or mass lesion/mass effect. Vascular: No hyperdense vessel or unexpected calcification. Skull: Normal. Negative for fracture or focal lesion. Sinuses/Orbits: No acute finding. Other: None. IMPRESSION: No evidence of acute intracranial abnormality on this study which is slightly degraded by beam hardening artifact. Electronically Signed   By: Maudry Mayhew MD   On: 11/27/2020 16:37   MR ANGIO HEAD WO CONTRAST  Result Date: 11/28/2020 CLINICAL DATA:  Abnormal mental status EXAM: MRI HEAD WITHOUT CONTRAST MRA HEAD WITHOUT CONTRAST MRA NECK WITHOUT CONTRAST TECHNIQUE: Multiplanar, multiecho pulse sequences of  the brain and surrounding structures were obtained without intravenous contrast. Angiographic images of the Circle of Willis were obtained using MRA technique without intravenous contrast. Angiographic images of the neck were obtained using MRA technique without intravenous contrast. Carotid stenosis measurements (when applicable) are obtained utilizing NASCET criteria, using the distal internal carotid diameter as the denominator. COMPARISON:  None. FINDINGS: MRI HEAD Brain: There is no acute infarction or intracranial hemorrhage. There is no intracranial mass, mass effect, or edema. There is no hydrocephalus or extra-axial fluid collection. Ventricles and sulci are within normal limits in size and configuration. Patchy foci of T2 hyperintensity in the supratentorial white matter are nonspecific but may reflect mild chronic microvascular ischemic changes. Vascular: T1  hyperintensity of the visualized left internal carotid artery apart from the distal supraclinoid portion likely reflects slow flow with preservation of flow void on T2. Skull and upper cervical spine: Normal marrow signal is preserved. Sinuses/Orbits: Minor mucosal thickening.  Orbits are unremarkable. Other: Sella is partially empty. Trace right mastoid fluid opacification. MRA HEAD Intracranial internal carotid arteries are patent. Middle and anterior cerebral arteries are patent. Mild narrowing of the distal M1 MCA is bilaterally. Intracranial vertebral arteries, basilar artery, posterior cerebral arteries are patent. There is no aneurysm. MRA NECK Common, internal, and external carotid arteries are patent. There is plaque at the ICA origins bilaterally. Approximately 50% stenosis is present on the right and less than 50% stenosis is present on the left. Extracranial vertebral arteries are patent. Origins are not well evaluated due to artifact. 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 Codominant vertebrals. IMPRESSION: No acute infarction, hemorrhage, or mass. Mild chronic microvascular ischemic changes. No large vessel occlusion. Plaque at the right ICA origin causes approximately 50% stenosis. Plaque at the left ICA origin causes less than 50% stenosis. Electronically Signed   By: Guadlupe Spanish M.D.   On: 11/28/2020 08:54   MR ANGIO NECK WO CONTRAST  Result Date: 11/28/2020 CLINICAL DATA:  Abnormal mental status EXAM: MRI HEAD WITHOUT CONTRAST MRA HEAD WITHOUT CONTRAST MRA NECK WITHOUT CONTRAST TECHNIQUE: Multiplanar, multiecho pulse sequences of the brain and surrounding structures were obtained without intravenous contrast. Angiographic images of the Circle of Willis were obtained using MRA technique without intravenous contrast. Angiographic images of the neck were obtained using MRA technique without intravenous contrast. Carotid stenosis measurements (when applicable) are obtained  utilizing NASCET criteria, using the distal internal carotid diameter as the denominator. COMPARISON:  None. FINDINGS: MRI HEAD Brain: There is no acute infarction or intracranial hemorrhage. There is no intracranial mass, mass effect, or edema. There is no hydrocephalus or extra-axial fluid collection. Ventricles and sulci are within normal limits in size and configuration. Patchy foci of T2 hyperintensity in the supratentorial white matter are nonspecific but may reflect mild chronic microvascular ischemic changes. Vascular: T1 hyperintensity of the visualized left internal carotid artery apart from the distal supraclinoid portion likely reflects slow flow with preservation of flow void on T2. Skull and upper cervical spine: Normal marrow signal is preserved. Sinuses/Orbits: Minor mucosal thickening.  Orbits are unremarkable. Other: Sella is partially empty. Trace right mastoid fluid opacification. MRA HEAD Intracranial internal carotid arteries are patent. Middle and anterior cerebral arteries are patent. Mild narrowing of the distal M1 MCA is bilaterally. Intracranial vertebral arteries, basilar artery, posterior cerebral arteries are patent. There is no aneurysm. MRA NECK Common, internal, and external carotid arteries are patent. There is plaque at the ICA origins bilaterally. Approximately 50% stenosis is present on the  right and less than 50% stenosis is present on the left. Extracranial vertebral arteries are patent. Origins are not well evaluated due to artifact. 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 Codominant vertebrals. IMPRESSION: No acute infarction, hemorrhage, or mass. Mild chronic microvascular ischemic changes. No large vessel occlusion. Plaque at the right ICA origin causes approximately 50% stenosis. Plaque at the left ICA origin causes less than 50% stenosis. Electronically Signed   By: Guadlupe SpanishPraneil  Patel M.D.   On: 11/28/2020 08:54   MR BRAIN WO CONTRAST  Result Date:  11/28/2020 CLINICAL DATA:  Abnormal mental status EXAM: MRI HEAD WITHOUT CONTRAST MRA HEAD WITHOUT CONTRAST MRA NECK WITHOUT CONTRAST TECHNIQUE: Multiplanar, multiecho pulse sequences of the brain and surrounding structures were obtained without intravenous contrast. Angiographic images of the Circle of Willis were obtained using MRA technique without intravenous contrast. Angiographic images of the neck were obtained using MRA technique without intravenous contrast. Carotid stenosis measurements (when applicable) are obtained utilizing NASCET criteria, using the distal internal carotid diameter as the denominator. COMPARISON:  None. FINDINGS: MRI HEAD Brain: There is no acute infarction or intracranial hemorrhage. There is no intracranial mass, mass effect, or edema. There is no hydrocephalus or extra-axial fluid collection. Ventricles and sulci are within normal limits in size and configuration. Patchy foci of T2 hyperintensity in the supratentorial white matter are nonspecific but may reflect mild chronic microvascular ischemic changes. Vascular: T1 hyperintensity of the visualized left internal carotid artery apart from the distal supraclinoid portion likely reflects slow flow with preservation of flow void on T2. Skull and upper cervical spine: Normal marrow signal is preserved. Sinuses/Orbits: Minor mucosal thickening.  Orbits are unremarkable. Other: Sella is partially empty. Trace right mastoid fluid opacification. MRA HEAD Intracranial internal carotid arteries are patent. Middle and anterior cerebral arteries are patent. Mild narrowing of the distal M1 MCA is bilaterally. Intracranial vertebral arteries, basilar artery, posterior cerebral arteries are patent. There is no aneurysm. MRA NECK Common, internal, and external carotid arteries are patent. There is plaque at the ICA origins bilaterally. Approximately 50% stenosis is present on the right and less than 50% stenosis is present on the left.  Extracranial vertebral arteries are patent. Origins are not well evaluated due to artifact. 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 Codominant vertebrals. IMPRESSION: No acute infarction, hemorrhage, or mass. Mild chronic microvascular ischemic changes. No large vessel occlusion. Plaque at the right ICA origin causes approximately 50% stenosis. Plaque at the left ICA origin causes less than 50% stenosis. Electronically Signed   By: Guadlupe SpanishPraneil  Patel M.D.   On: 11/28/2020 08:54   US Aorta  Result Date: 11/27/2020 CLINICAL DATA:  Hypotension, syncope.  Abdominal aneurysm? EXAM: ULTRASOUND OF ABDOMINAL AORTA TECHNIQUE: Ultrasound examination of the abdominal aorta and proximal common iliac arteries was performed to evaluate for aneurysm. Additional color and Doppler images of the distal aorta were obtained to document patency. COMPARISON:  None. FINDINGS: Abdominal aortic measurements as follows: Proximal:  1.6 x 2.1 cm cm Mid:  1.4 x 2.0 cm cm Distal:  2 x 2.7 cm cm Patent: Yes, peak systolic velocity is 146 cm/s Common iliac arteries: Not seen. IMPRESSION: No abdominal aortic aneurysm. Electronically Signed   By: Bary RichardStan  Maynard M.D.   On: 11/27/2020 18:20   DG Chest Port 1 View  Result Date: 11/27/2020 CLINICAL DATA:  Hypertension, syncope, altered mental status. EXAM: PORTABLE CHEST 1 VIEW COMPARISON:  None FINDINGS: Heart is mildly enlarged. No confluent airspace opacities, effusions or  edema. Mediastinal contours within normal limits. No acute bony abnormality. IMPRESSION: Cardiomegaly.  No active disease. Electronically Signed   By: Charlett Nose M.D.   On: 11/27/2020 16:49   EEG adult  Result Date: 11/28/2020 Charlsie Quest, MD     11/28/2020 12:17 PM Patient Name: YESLIN DELIO MRN: [id number] Epilepsy Attending: Charlsie Quest Referring Physician/Provider: Dr Shauna Hugh Date: 11/28/2020 Duration: 20.40 mins Patient history: 65 year old female with transient loss of  consciousness.  EEG to evaluate for seizures. Level of alertness: Awake AEDs during EEG study: None Technical aspects: This EEG study was done with scalp electrodes positioned according to the 10-20 International system of electrode placement. Electrical activity was acquired at a sampling rate of 500Hz  and reviewed with a high frequency filter of 70Hz  and a low frequency filter of 1Hz . EEG data were recorded continuously and digitally stored. Description: The posterior dominant rhythm consists of 8 Hz activity of moderate voltage (25-35 uV) seen predominantly in posterior head regions, symmetric and reactive to eye opening and eye closing. Physiologic photic driving was not seen during photic stimulation.  Hyperventilation was not performed.   IMPRESSION: This study is within normal limits. No seizures or epileptiform discharges were seen throughout the recording. Priyanka     Scheduled Meds: . aspirin  81 mg Oral Daily  . atorvastatin  40 mg Oral QHS  . clopidogrel  75 mg Oral Daily  . escitalopram  10 mg Oral Daily  . insulin aspart  0-15 Units Subcutaneous TID AC & HS   Continuous Infusions:   LOS: 0 days   Time Spent in minutes   45 minutes  Jamae Tison D.O. on 11/28/2020 at 1:29 PM  Between 7am to 7pm - Please see pager noted on [email or web address]  After 7pm go to [email or web address]  And look for the night coverage person covering for me after hours  [facility] Office  [phone]

## 2020-11-28 NOTE — ED Notes (Signed)
This nurse mistakenly checked off EEG complete.

## 2020-11-28 NOTE — Progress Notes (Signed)
Occupational Therapy Evaluation Patient Details Name: Sydney Baird MRN: [id number] DOB: 1955/10/23 Today's Date: 11/28/2020    History of Present Illness This 65 y.o. female presented to ED with heaviness/tingling Lt side of body followed by syncopal type of event. She was found to be extremely hypotensive 95/35.  CT and MRI/MRA showed no acute infarct, but bil. ICA stenosis.  PMH includes: OSA, DM, TIA   Clinical Impression   Pt admitted with above. She demonstrates the below listed deficits and will benefit from continued OT to maximize safety and independence with BADLs.  Pt presents to OT with decreased activity tolerance, and ? Mild weakness of Lt UE (poor effort with testing).  She was seen in conjunction with PT and eval limited by pt with symptomatic orthostatic hypotension.  She reports she lives with her spouse and was fully independent PTA.  Will follow acutely, but anticipate good progress once BP stabilized.     11/28/20 1442  Orthostatic Lying   BP- Lying 160/70  Pulse- Lying 73  Orthostatic Sitting  BP- Sitting 173/68  Pulse- Sitting 87  Orthostatic Standing at 0 minutes  BP- Standing at 0 minutes 119/65  Pulse- Standing at 0 minutes 86        Follow Up Recommendations  No OT follow up;Supervision - Intermittent    Equipment Recommendations  None recommended by OT    Recommendations for Other Services       Precautions / Restrictions Precautions Precautions: Fall Restrictions Weight Bearing Restrictions: No      Mobility Bed Mobility Overal bed mobility: Needs Assistance Bed Mobility: Supine to Sit;Sit to Supine     Supine to sit: Min assist;+2 for safety/equipment Sit to supine: Mod assist;+2 for physical assistance;+2 for safety/equipment   General bed mobility comments: pt with symptomatic orthostatic hypotension and was assisted with return to supine    Transfers Overall transfer level: Needs assistance Equipment used: 2 person hand held  assist Transfers: Sit to/from Stand;Stand Pivot Transfers Sit to Stand: Min assist;+2 physical assistance;+2 safety/equipment         General transfer comment: requires assist for safety and balance moving from sit to stand from ED stretcher - pt 4'11"    Balance Overall balance assessment: Needs assistance Sitting-balance support: Feet unsupported Sitting balance-Leahy Scale: Fair     Standing balance support: Bilateral upper extremity supported Standing balance-Leahy Scale: Poor Standing balance comment: requires UE support                           ADL either performed or assessed with clinical judgement   ADL Overall ADL's : Needs assistance/impaired Eating/Feeding: Independent   Grooming: Wash/dry hands;Wash/dry face;Oral care;Brushing hair;Set up;Sitting   Upper Body Bathing: Supervision/ safety;Set up;Sitting   Lower Body Bathing: Moderate assistance;Sit to/from stand   Upper Body Dressing : Set up;Supervision/safety;Sitting   Lower Body Dressing: Moderate assistance;Sit to/from stand Lower Body Dressing Details (indicate cue type and reason): assist with standing and pulling pants over hips   Toilet Transfer Details (indicate cue type and reason): unable secondary to orthostasis Toileting- Clothing Manipulation and Hygiene: Moderate assistance;Sit to/from stand       Functional mobility during ADLs: Minimal assistance;+2 for physical assistance;+2 for safety/equipment       Vision Baseline Vision/History: Wears glasses Wears Glasses: At all times Patient Visual Report: No change from baseline       Perception Perception Perception Tested?: Yes   Praxis Praxis Praxis tested?: Within functional  limits    Pertinent Vitals/Pain Pain Assessment: Faces Faces Pain Scale: Hurts little more Pain Location: headache Pain Descriptors / Indicators: Headache Pain Intervention(s): Limited activity within patient's tolerance;Monitored during  session;Repositioned     Hand Dominance Left   Extremity/Trunk Assessment Upper Extremity Assessment Upper Extremity Assessment: LUE deficits/detail LUE Deficits / Details: grossly 3+/5 - 4-/5, but pt with poor effort with all attempts to assess.  She reports impaired sensation subjectively, but uses Lt UE functionally to assist with mobility, and to don socks LUE Sensation: decreased light touch   Lower Extremity Assessment Lower Extremity Assessment: Defer to PT evaluation   Cervical / Trunk Assessment Cervical / Trunk Assessment: Normal   Communication Communication Communication: No difficulties   Cognition Arousal/Alertness: Awake/alert Behavior During Therapy: WFL for tasks assessed/performed Overall Cognitive Status: Within Functional Limits for tasks assessed                                     General Comments       Exercises     Shoulder Instructions      Home Living Family/patient expects to be discharged to:: Private residence Living Arrangements: Spouse/significant other Available Help at Discharge: Family;Available 24 hours/day Type of Home: House Home Access: Elevator     Home Layout: One level     Bathroom Shower/Tub: Producer, television/film/video: Standard     Home Equipment: Shower seat;Grab bars - tub/shower;Walker - 2 wheels;Cane - single point          Prior Functioning/Environment Level of Independence: Independent        Comments: 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        OT Problem List: Decreased strength;Decreased activity tolerance;Impaired balance (sitting and/or standing);Cardiopulmonary status limiting activity;Obesity      OT Treatment/Interventions: Self-care/ADL training;Therapeutic exercise;DME and/or AE instruction;Therapeutic activities;Patient/family education;Balance training;Neuromuscular education    OT Goals(Current goals can be found in the care plan section) Acute Rehab OT  Goals Patient Stated Goal: did not state OT Goal Formulation: With patient Time For Goal Achievement: 12/12/20 Potential to Achieve Goals: Good ADL Goals Pt Will Perform Grooming: with supervision;standing Pt Will Perform Upper Body Bathing: with supervision;standing Pt Will Perform Lower Body Bathing: with supervision;sit to/from stand Pt Will Perform Upper Body Dressing: with supervision;sitting Pt Will Perform Lower Body Dressing: with supervision;sit to/from stand Pt Will Transfer to Toilet: with supervision;ambulating;regular height toilet;grab bars Pt Will Perform Toileting - Clothing Manipulation and hygiene: with supervision;sit to/from stand Pt Will Perform Tub/Shower Transfer: Tub transfer;Shower transfer;ambulating;shower seat  OT Frequency: Min 2X/week   Barriers to D/C:            Co-evaluation              AM-PAC OT "6 Clicks" Daily Activity     Outcome Measure Help from another person eating meals?: None Help from another person taking care of personal grooming?: A Little Help from another person toileting, which includes using toliet, bedpan, or urinal?: A Lot Help from another person bathing (including washing, rinsing, drying)?: A Lot Help from another person to put on and taking off regular upper body clothing?: A Little Help from another person to put on and taking off regular lower body clothing?: A Lot 6 Click Score: 16   End of Session Nurse Communication: Mobility status;Other (comment) (orthostatic hypotension)  Activity Tolerance: Patient tolerated treatment well  Patient left: in bed;with call bell/phone within reach;with family/visitor present  OT Visit Diagnosis: Unsteadiness on feet (R26.81)                Time: 3300-7622 OT Time Calculation (min): 27 min Charges:  OT General Charges $OT Visit: 1 Visit OT Evaluation $OT Eval Moderate Complexity: 1 Mod  Eber Jones., OTR/L Acute Rehabilitation Services Pager [phone] Office  [phone]   Jeani Hawking M 11/28/2020, 5:01 PM

## 2020-11-28 NOTE — Procedures (Signed)
Patient Name: Sydney Baird  MRN: [id number]  Epilepsy Attending: Charlsie Quest  Referring Physician/Provider: Dr Shauna Hugh Date: 11/28/2020 Duration: 20.40 mins  Patient history: 65 year old female with transient loss of consciousness.  EEG to evaluate for seizures.  Level of alertness: Awake  AEDs during EEG study: None  Technical aspects: This EEG study was done with scalp electrodes positioned according to the 10-20 International system of electrode placement. Electrical activity was acquired at a sampling rate of 500Hz  and reviewed with a high frequency filter of 70Hz  and a low frequency filter of 1Hz . EEG data were recorded continuously and digitally stored.   Description: The posterior dominant rhythm consists of 8 Hz activity of moderate voltage (25-35 uV) seen predominantly in posterior head regions, symmetric and reactive to eye opening and eye closing. Physiologic photic driving was not seen during photic stimulation.  Hyperventilation was not performed.     IMPRESSION: This study is within normal limits. No seizures or epileptiform discharges were seen throughout the recording.  [name] 

## 2020-11-28 NOTE — ED Notes (Signed)
Patient transported to MRI 

## 2020-11-28 NOTE — Progress Notes (Signed)
Echocardiogram 2D Echocardiogram has been performed.  Delcie Roch 11/28/2020, 5:49 PM

## 2020-11-28 NOTE — Progress Notes (Signed)
RT note. Pt. Will have husband bring in CPAP machine from home. Pt. Refusing hospital CPAP at this time. Told pt. To let RN/RT know if she decides to change her mind. RT will continue to monitor

## 2020-11-28 NOTE — ED Notes (Signed)
Pt denies any complaints to this RN.

## 2020-11-28 NOTE — Progress Notes (Signed)
EEG complete - results pending 

## 2020-11-28 NOTE — ED Notes (Signed)
Dr Nicky Pugh messaged for diet order

## 2020-11-28 NOTE — Progress Notes (Incomplete)
   11/28/20 1442  Orthostatic Lying   BP- Lying 160/70  Pulse- Lying 73  Orthostatic Sitting  BP- Sitting 173/68  Pulse- Sitting 87  Orthostatic Standing at 0 minutes  BP- Standing at 0 minutes 119/65  Pulse- Standing at 0 minutes 86

## 2020-11-28 NOTE — H&P (Signed)
History and Physical    Sydney GentaDeborah B Kroeger ZOX:[id number] DOB: Oct 28, 1955 DOA: 11/27/2020  PCP: Patient, No Pcp Per  Patient coming from: Home   Chief Complaint:  Chief Complaint  Patient presents with  . Loss of Consciousness     HPI:    65 year old female with past medical history of hyperlipidemia, obstructive sleep apnea on CPAP, bilateral carotid stenosis, hypertension, migraine headaches, diabetes mellitus type 2, anxiety disorder presents to [facility] room via EMS after experiencing a prolonged episode of loss of consciousness and hypotension.  Patient explains that she and her family went to [employer] the evening of 3/2 for dinner.  Before sitting down to eat, patient began to feel a heaviness and tingling of the left side of her body.  Shortly after the patient sat down and began to eat some bread she felt a sensation of intense lightheadedness.  Shortly thereafter, she states that she entered a state of consciousness where she could not tell if she was a fully awake or asleep.  She did hear muffled sounds of her family calling out to her touching her but she could not willingly open her eyes or control her body.  She knew that she had completely lost consciousness at some point but is unaware of the sequence of events or timeline.  Shortly after this episode began, patient felt her body being jostled around and she was placed in an EMS transportation truck and she could hear the voices of the EMS personnel.  She still did not help control of herself nor could she wake up.  Was not until the patient arrived in [facility] emergency department the patient finally began to wake up.  According to my discussion with emergency department staff, the patient was extremely lethargic on arrival and unable to adequately participate in conversation with the care staff.  Per review of EMS notes, patient was found to be profoundly hypotensive while still the  restaurant with blood pressures as low as 50/30.  There was a short while were obtaining a pulse was extremely difficult but CPR was not administered.  Patient was transiently placed on epinephrine infusion until blood pressures came up and was then transitioned off.  Upon evaluation in the emergency department CT head was found to be unremarkable.  Chest x-ray revealed no evidence of infection.  Patient was found to be somewhat hyponatremic with a sodium of 131.  Patient was hydrated with intravenous isotonic fluids.  No benzodiazepines or antiepileptics were administered.  Patient was initially found to be hypotensive on arrival with blood pressure of 95/35 which quickly resolved after administration of intravenous fluids.  The hospitalist group was then called to assess the patient for admission to the hospital.  Of note, patient continues to complain of left-sided tingling and dullness of the left upper and left lower extremities.  Review of Systems:   Review of Systems  Neurological: Positive for tingling, sensory change, loss of consciousness and headaches.  All other systems reviewed and are negative.   Past Medical History:  Diagnosis Date  . Diabetes mellitus without complication (HCC)   . Hypertension   . TIA (transient ischemic attack)     Past Surgical History:  Procedure Laterality Date  . ABDOMINAL HYSTERECTOMY       reports that she has never smoked. She has never used smokeless tobacco. No history on file for alcohol use and drug use.  Allergies  Allergen Reactions  . Hydrocodone-Acetaminophen   . Morphine  And Related Nausea And Vomiting  . Propranolol Swelling  . Shellfish Allergy     Other reaction(s): Other (See Comments)    Family History  Problem Relation Age of Onset  . Diabetes Mother   . Diabetes Father      Prior to Admission medications   Medication Sig Start Date End Date Taking? Authorizing Provider  acetaminophen (TYLENOL) 500 MG tablet Take  1,000 mg by mouth every 6 (six) hours as needed for moderate pain or headache.   Yes [provider]  ASHWAGANDHA PO Take 2 tablets by mouth daily. gummy   Yes [provider]  aspirin 81 MG chewable tablet Chew 81 mg by mouth daily.   Yes [provider]  atorvastatin (LIPITOR) 40 MG tablet Take 40 mg by mouth at bedtime. 12/18/19  Yes [provider]  Carboxymethylcellulose Sodium 1 % GEL Place 1 drop into both eyes daily as needed. Dry eyes   Yes [provider]  Cholecalciferol (VITAMIN D) 50 MCG (2000 UT) tablet Take 2,000 Units by mouth daily.   Yes [provider]  clopidogrel (PLAVIX) 75 MG tablet Take 75 mg by mouth daily. 02/14/20 08/01/21 Yes [provider]  Cyanocobalamin 1000 MCG/15ML LIQD Take 15 mLs by mouth daily.   Yes [provider]  escitalopram (LEXAPRO) 10 MG tablet Take 10 mg by mouth daily. 02/09/20  Yes [provider]  lisinopril (ZESTRIL) 5 MG tablet Take 5 mg by mouth daily. 08/13/20  Yes [provider]  metFORMIN (GLUCOPHAGE) 500 MG tablet Take 500 mg by mouth in the morning and at bedtime. 08/13/20  Yes [provider]  Multiple Vitamins-Minerals (ONE-A-DAY WOMENS 50+) TABS Take 1 tablet by mouth daily.   Yes [provider]    Physical Exam: Vitals:   11/27/20 2200 11/28/20 0003 11/28/20 0119 11/28/20 0215  BP: (!) 113/51 122/68 (!) 126/53 (!) 133/53  Pulse: 62 65 (!) 59 77  Resp: 19 18 14 17   Temp:  98 F (36.7 C) 98.1 F (36.7 C)   TempSrc:  Oral    SpO2: 96% 98% 96% 99%  Weight:      Height:        Constitutional: Acute alert and oriented x3, no associated distress.   Skin: no rashes, no lesions, good skin turgor noted. Eyes: Pupils are equally reactive to light.  No evidence of scleral icterus or conjunctival pallor.  ENMT: Moist mucous membranes noted.  Posterior pharynx clear of any exudate or lesions.   Neck: normal, supple, no masses, no  thyromegaly.  No evidence of jugular venous distension.   Respiratory: clear to auscultation bilaterally, no wheezing, no crackles. Normal respiratory effort. No accessory muscle use.  Cardiovascular: Regular rate and rhythm, no murmurs / rubs / gallops. No extremity edema. 2+ pedal pulses. No carotid bruits.  Chest:   Nontender without crepitus or deformity.   Back:   Nontender without crepitus or deformity. Abdomen: Abdomen is soft and nontender.  No evidence of intra-abdominal masses.  Positive bowel sounds noted in all quadrants.   Musculoskeletal: No joint deformity upper and lower extremities. Good ROM, no contractures. Normal muscle tone.  Neurologic: Patient reports decreased sensation of the left side of the body.  Some questionable weakness of the distal muscle groups left lower extremity.  CN 2-12 grossly intact. Sensation intact.    Patient is following all commands.  Patient is responsive to verbal stimuli.   Psychiatric: Patient exhibits depressed mood with appropriate affect.  Patient  seems to possess insight as to their current situation.     Labs on Admission: I have personally reviewed following labs and imaging studies -   CBC: Recent Labs  Lab 11/27/20 1600  WBC 8.3  NEUTROABS 5.1  HGB 13.0  HCT 38.7  MCV 88.2  PLT 411*   Basic Metabolic Panel: Recent Labs  Lab 11/27/20 1600  NA 131*  K 3.8  CL 99  CO2 19*  GLUCOSE 131*  BUN 10  CREATININE 0.88  CALCIUM 9.2   GFR: Estimated Creatinine Clearance: 64.3 mL/min (by C-G formula based on SCr of 0.88 mg/dL). Liver Function Tests: Recent Labs  Lab 11/27/20 1600  AST 19  ALT 15  ALKPHOS 37*  BILITOT 0.8  PROT 6.6  ALBUMIN 3.5   No results for input(s): LIPASE, AMYLASE in the last 168 hours. No results for input(s): AMMONIA in the last 168 hours. Coagulation Profile: No results for input(s): INR, PROTIME in the last 168 hours. Cardiac Enzymes: No results for input(s): CKTOTAL, CKMB, CKMBINDEX,  TROPONINI in the last 168 hours. BNP (last 3 results) No results for input(s): PROBNP in the last 8760 hours. HbA1C: No results for input(s): HGBA1C in the last 72 hours. CBG: Recent Labs  Lab 11/27/20 1550  GLUCAP 126*   Lipid Profile: No results for input(s): CHOL, HDL, LDLCALC, TRIG, CHOLHDL, LDLDIRECT in the last 72 hours. Thyroid Function Tests: No results for input(s): TSH, T4TOTAL, FREET4, T3FREE, THYROIDAB in the last 72 hours. Anemia Panel: No results for input(s): VITAMINB12, FOLATE, FERRITIN, TIBC, IRON, RETICCTPCT in the last 72 hours. Urine analysis: No results found for: COLORURINE, APPEARANCEUR, LABSPEC, PHURINE, GLUCOSEU, HGBUR, BILIRUBINUR, KETONESUR, PROTEINUR, UROBILINOGEN, NITRITE, LEUKOCYTESUR  Radiological Exams on Admission - Personally Reviewed: CT HEAD WO CONTRAST  Result Date: 11/27/2020 CLINICAL DATA:  Mental status change, unknown cause EXAM: CT HEAD WITHOUT CONTRAST TECHNIQUE: Contiguous axial images were obtained from the base of the skull through the vertex without intravenous contrast. COMPARISON:  None. FINDINGS: Study is degraded by beam hardening artifact from metallic densities external to the scalp along the posterior calvarium. Brain: No evidence of acute infarction, hemorrhage, hydrocephalus, extra-axial collection or mass lesion/mass effect. Vascular: No hyperdense vessel or unexpected calcification. Skull: Normal. Negative for fracture or focal lesion. Sinuses/Orbits: No acute finding. Other: None. IMPRESSION: No evidence of acute intracranial abnormality on this study which is slightly degraded by beam hardening artifact. Electronically Signed   By: Maudry Mayhew MD   On: 11/27/2020 16:37   US Aorta  Result Date: 11/27/2020 CLINICAL DATA:  Hypotension, syncope.  Abdominal aneurysm? EXAM: ULTRASOUND OF ABDOMINAL AORTA TECHNIQUE: Ultrasound examination of the abdominal aorta and proximal common iliac arteries was performed to evaluate for aneurysm.  Additional color and Doppler images of the distal aorta were obtained to document patency. COMPARISON:  None. FINDINGS: Abdominal aortic measurements as follows: Proximal:  1.6 x 2.1 cm cm Mid:  1.4 x 2.0 cm cm Distal:  2 x 2.7 cm cm Patent: Yes, peak systolic velocity is 146 cm/s Common iliac arteries: Not seen. IMPRESSION: No abdominal aortic aneurysm. Electronically Signed   By: Bary Richard M.D.   On: 11/27/2020 18:20   DG Chest Port 1 View  Result Date: 11/27/2020 CLINICAL DATA:  Hypertension, syncope, altered mental status. EXAM: PORTABLE CHEST 1 VIEW COMPARISON:  None FINDINGS: Heart is mildly enlarged. No confluent airspace opacities, effusions or edema. Mediastinal contours within normal limits. No acute bony abnormality. IMPRESSION: Cardiomegaly.  No active disease. Electronically Signed  By: Charlett Nose M.D.   On: 11/27/2020 16:49    EKG: Personally reviewed.  Rhythm is normal sinus rhythm with heart rate of 68 bpm.  No dynamic ST segment changes appreciated.  Assessment/Plan Principal Problem:   Loss of consciousness (HCC)   Patient experienced left-sided tingling followed by an altered state of consciousness loss of consciousness and hypotension followed by a period of confusion before fully arousing.  The cause of this constellation of symptoms is not immediately clear.  Symptoms are not solely secondary to syncope.  Additionally, seizure activity is possible especially with the prolonged period of lethargy after the event however this does not explain the of hypotension the patient experienced.  The patient may have experienced an episode of vasovagal syncope hence the transient hypotension that the patient experienced which resulted in transient neurologic deficits considering patient's substantial bilateral carotid stenosis.  Seizure activity may or may not of been provoked considering patient's prolonged lethargic state akin to a postictal state.  I discussed this chain of  symptoms with Dr. Thomasena Edis with neurology who is recommending noncontrast MRI brain, MRA of the head and neck as well as EEG.  He is also recommending overnight observation on telemetry with serial neurologic checks.  We will formally consult neurology tomorrow if any of this is abnormal.  Continuing serial neurologic checks  PT, OT evaluation in the morning.  Continuing home regimen of aspirin and Plavix.  Holding home antihypertensive therapy.  Active Problems:   Bilateral carotid artery stenosis   Patient is known history of substantial bilateral carotid stenosis with 70% lesion of the right ICA as well as additional 50% lesion of the left carotid.  Uncertain whether this disease contributed in any way to the patient's symptoms this evening.  Continue home regimen antiplatelet therapy.  Continue statin therapy.  If we indeed identify worsened carotid disease with evidence of an acute cerebrovascular ischemic event then vascular surgery would need to get involved.    Hypotension   Transient hypotension after the patient's loss of consciousness  Unclear etiology, possibly related to vasovagal syncope?  Hypertension seems to be resolving with intravenous fluids which will be quickly discontinued.  Holding antihypertensives temporarily    Mixed hyperlipidemia  . Continuing home regimen of lipid lowering therapy. . Obtain lipid panel in the morning    Obstructive sleep apnea   Continue home regimen of CPAP nightly    Type 2 diabetes mellitus without complication, without long-term current use of insulin (HCC)  . Patient been placed on Accu-Cheks before every meal and nightly with sliding scale insulin . Holding home regimen of hypoglycemics . Hemoglobin A1C ordered . Diabetic Diet    Generalized anxiety disorder    Continue home regimen of Lexapro   Code Status:  Full code Family Communication: deferred   Status is: Observation  The patient remains OBS  appropriate and will d/c before 2 midnights.  Dispo: The patient is from: Home              Anticipated d/c is to: Home              Patient currently is not medically stable to d/c.   Difficult to place patient No        Marinda Elk MD [facility] Pager [phone]  If 7PM-7AM, please contact night-coverage [email or web address] Use universal [place] password for that web site. If you do not have the password, please call the hospital operator.  11/28/2020, 3:12 AM

## 2020-11-29 DIAGNOSIS — E119 Type 2 diabetes mellitus without complications: Secondary | ICD-10-CM

## 2020-11-29 DIAGNOSIS — E782 Mixed hyperlipidemia: Secondary | ICD-10-CM

## 2020-11-29 DIAGNOSIS — F411 Generalized anxiety disorder: Secondary | ICD-10-CM | POA: Diagnosis not present

## 2020-11-29 DIAGNOSIS — I951 Orthostatic hypotension: Secondary | ICD-10-CM

## 2020-11-29 DIAGNOSIS — I6523 Occlusion and stenosis of bilateral carotid arteries: Secondary | ICD-10-CM | POA: Diagnosis not present

## 2020-11-29 DIAGNOSIS — G4733 Obstructive sleep apnea (adult) (pediatric): Secondary | ICD-10-CM

## 2020-11-29 LAB — GLUCOSE, CAPILLARY: Glucose-Capillary: 114 mg/dL — ABNORMAL HIGH (ref 70–99)

## 2020-11-29 NOTE — Progress Notes (Signed)
Nsg Discharge Note  Admit Date:  11/27/2020 Discharge date: 11/29/2020   Sydney Baird to be D/C'd Home per MD order.  AVS completed.  Copy for chart, and copy for patient signed, and dated. Patient/caregiver able to verbalize understanding.  Discharge Medication: Allergies as of 11/29/2020      Reactions   Hydrocodone-acetaminophen    Morphine And Related Nausea And Vomiting   Propranolol Swelling   Shellfish Allergy    Other reaction(s): Other (See Comments)      Medication List    TAKE these medications   acetaminophen 500 MG tablet Commonly known as: TYLENOL Take 1,000 mg by mouth every 6 (six) hours as needed for moderate pain or headache.   ASHWAGANDHA PO Take 2 tablets by mouth daily. gummy   aspirin 81 MG chewable tablet Chew 81 mg by mouth daily.   atorvastatin 40 MG tablet Commonly known as: LIPITOR Take 40 mg by mouth at bedtime.   Carboxymethylcellulose Sodium 1 % Gel Place 1 drop into both eyes daily as needed. Dry eyes   clopidogrel 75 MG tablet Commonly known as: PLAVIX Take 75 mg by mouth daily.   Cyanocobalamin 1000 MCG/15ML Liqd Take 15 mLs by mouth daily.   escitalopram 10 MG tablet Commonly known as: LEXAPRO Take 10 mg by mouth daily.   lisinopril 5 MG tablet Commonly known as: ZESTRIL Take 5 mg by mouth daily.   metFORMIN 500 MG tablet Commonly known as: GLUCOPHAGE Take 500 mg by mouth in the morning and at bedtime.   One-A-Day Womens 50+ Tabs Take 1 tablet by mouth daily.   Vitamin D 50 MCG (2000 UT) tablet Take 2,000 Units by mouth daily.       Discharge Assessment: Vitals:   11/29/20 0533 11/29/20 1028  BP: (!) 177/77 (!) 144/74  Pulse: 64   Resp: 18   Temp: 97.9 F (36.6 C)   SpO2: 98%    Skin clean, dry and intact without evidence of skin break down, no evidence of skin tears noted. IV catheter discontinued intact. Site without signs and symptoms of complications - no redness or edema noted at insertion site, patient  denies c/o pain - only slight tenderness at site.  Dressing with slight pressure applied.  D/c Instructions-Education: Discharge instructions given to patient/family with verbalized understanding. D/c education completed with patient/family including follow up instructions, medication list, d/c activities limitations if indicated, with other d/c instructions as indicated by MD - patient able to verbalize understanding, all questions fully answered. Patient instructed to return to ED, call 911, or call MD for any changes in condition.  Patient escorted via WC, and D/C home via private auto.  Boykin Nearing, RN 11/29/2020 10:32 AM

## 2020-11-29 NOTE — Discharge Summary (Signed)
Physician Discharge Summary  Binnie RailDeborah B Sava ZOX:[id number] DOB: 1955-11-10 DOA: 11/27/2020  PCP: Patient, No Pcp Per  Admit date: 11/27/2020 Discharge date: 11/29/2020  Time spent: 45 minutes  Recommendations for Outpatient Follow-up:  Patient will be discharged to home.  Patient will need to follow up with primary care provider within one week of discharge.  Follow-up with neurology, Dr. Hyacinth MeekerMiller.  Patient should continue medications as prescribed.  Patient should follow a heart healthy/carb modified diet.   Discharge Diagnoses:   Discharge Condition: Stable  Diet recommendation: heart healthy/carb modified  Filed Weights   11/27/20 1629  Weight: 90.7 kg    History of present illness:  On 11/28/2020 by Dr. Shauna HughGeorge Shalhoub 65 year old female with past medical history of hyperlipidemia, obstructive sleep apnea on CPAP, bilateral carotid stenosis, hypertension, migraine headaches, diabetes mellitus type 2, anxiety disorder [facility] via EMS after experiencing a prolonged episode of loss of consciousness and hypotension.  Patient explains that she [place] [employer] [place] of [place]. Before sitting down to eat, patient began to feel a heaviness and tingling of the left side of her body.  Shortly after the patient sat down and began to eat some bread she felt a sensation of intense lightheadedness. Shortly thereafter, she states that she entered a state of consciousness where she could not tell if she was a fully awake or asleep. She did hear muffled soundsof her family calling out to her touching her but she could not willingly open her eyes or control her body. She knew that she had completely lost consciousness at some point but is unaware of the sequence of events or timeline.  Shortly after this episode began, patient felt her body being jostled around and she was placed in an EMS transportation truck and she  could hear the voices of the EMS personnel. She still did not help control of herself nor could she wake up.  Was not until the patient arrived in [facility] emergency department the patient finally began to wake up. According to my discussion with emergency department staff, the patient was extremely lethargic on arrival and unable to adequately participate in conversation with the care staff.  Per review of EMS notes, patient was found to be profoundly hypotensive while still the restaurant with blood pressures as low as 50/30. There was a short while were obtaining a pulse was extremely difficult but CPR was not administered. Patient was transiently placed on epinephrine infusion until blood pressures came up and was then transitioned off.  Upon evaluation in the emergency department CT head was found to be unremarkable. Chest x-ray revealed no evidence of infection. Patient was found to be somewhat hyponatremic with a sodium of 131. Patient was hydrated with intravenous isotonic fluids. No benzodiazepines or antiepileptics were administered. Patient was initially found to be hypotensive on arrival with blood pressure of 95/35 which quickly resolved after administration of intravenous fluids. The hospitalist group was then called to assess the patient for admission to the hospital.  Of note, patient continues to complain of left-sided tingling and dullness of the left upper and left lower extremities.  Hospital Course:  Loss of consciousness-possible syncope versus TIA -Patient experienced left-sided tingling followed by an altered state of consciousness and hypotension for period of time with confusion. -Patient daily she still has some left upper extremity numbness and weakness. -CT head unremarkable -MRI/MRA brain/head neck: No acute infarction, hemorrhage or mass.  No large  vessel occlusion.  Bilateral ICA stenosis 50%. -EEG study within normal limits, no seizure or left  form discharges seen throughout recording. -Currently on aspirin, Plavix, statin -Echocardiogram showed an EF of 60 to 65%, no regional wall motion abnormalities.  Mild asymmetric left ventricular hypertrophy of the basal-septal segment.  LV diastolic parameters were normal. -PT and OT evaluated patient, no follow-up needed -Discussed with neurology, suspect possible syncopal episode with bilateral artery stenosis causing patient's issues.  MRI and CT were negative for CVA, if this was a TIA, patient is currently on appropriate treatment. -Patient states that she follows up with a neurologist as an outpatient, Dr. Royston Sinner with [facility] -Patient was noted to have orthostatic hypotension from sitting to standing.  Discussed wearing compression stockings with the patient. -Still do not have an exact cause of patient's issues or presentation as she had this episode occur while in a sitting position, no change in positioning.  We discussed anxiety and stress, patient stated she felt much more anxious and stressed out that particular day and prior to this episode.  Recommended that patient follow-up with her neurologist closely as well as as her primary care physician to discuss possible lifestyle modifications as well as changes in medications to better control her stress and anxiety.  Bilateral carotid artery stenosis -Patient with known history of bilateral carotid artery stenosis with 70% lesion on the right, 50% lesion on the left. -MRA head and neck as above -Continue statin, plavix, aspirin  Hypotension, orthostatic -Transient -BP currently stable -Antihypertensive medications currently held -BP did drop from 173/68 in a sitting position to 119/65 in a standing position.  Hyperlipidemia -continue statin -Lipid panel showed total cholesterol 139, HDL 34, LDL 113, triglycerides 162 -Discussed increasing patient's statin to better control her LDL, however she stated that she had tried 80  mg of atorvastatin in the past and could not tolerate this dose.  Recommended that she follow-up with her PCP to discuss possibly using Crestor.  Obstructive sleep apnea -Continue CPAP  Diabetes mellitus, type II -Hemoglobin A1c 6.5 -Continue Metformin on discharge  Generalized anxiety disorder -Continue Lexapro  Procedures: Echocardiogram EEG  Consultations: Neurology via phone, Dr. Amada Jupiter  Discharge Exam: Vitals:   11/28/20 2333 11/29/20 0533  BP: (!) 163/69 (!) 177/77  Pulse: 67 64  Resp: 18 18  Temp: 97.9 F (36.6 C) 97.9 F (36.6 C)  SpO2: 97% 98%     General: Well developed, well nourished, NAD, appears stated age  HEENT: NCAT, mucous membranes moist.  Cardiovascular: S1 S2 auscultated, RRR, no murmur  Respiratory: Clear to auscultation bilaterally with equal chest rise  Abdomen: Soft, obese, nontender, nondistended, + bowel sounds  Extremities: warm dry without cyanosis clubbing or edema  Neuro: AAOx3, nonfocal  Psych: appropriate mood and affect  Discharge Instructions Discharge Instructions    Discharge instructions   Complete by: As directed    Patient will be discharged to home.  Patient will need to follow up with primary care provider within one week of discharge.  Follow-up with neurology, Dr. Hyacinth Meeker.  Patient should continue medications as prescribed.  Patient should follow a heart healthy/carb modified diet. Do not drive until seen by your neurologist.   Driving Restrictions   Complete by: As directed    Do not drive until seen by your neurologist.   Increase activity slowly   Complete by: As directed      Allergies as of 11/29/2020      Reactions   Hydrocodone-acetaminophen  Morphine And Related Nausea And Vomiting   Propranolol Swelling   Shellfish Allergy    Other reaction(s): Other (See Comments)      Medication List    TAKE these medications   acetaminophen 500 MG tablet Commonly known as: TYLENOL Take 1,000 mg by  mouth every 6 (six) hours as needed for moderate pain or headache.   ASHWAGANDHA PO Take 2 tablets by mouth daily. gummy   aspirin 81 MG chewable tablet Chew 81 mg by mouth daily.   atorvastatin 40 MG tablet Commonly known as: LIPITOR Take 40 mg by mouth at bedtime.   Carboxymethylcellulose Sodium 1 % Gel Place 1 drop into both eyes daily as needed. Dry eyes   clopidogrel 75 MG tablet Commonly known as: PLAVIX Take 75 mg by mouth daily.   Cyanocobalamin 1000 MCG/15ML Liqd Take 15 mLs by mouth daily.   escitalopram 10 MG tablet Commonly known as: LEXAPRO Take 10 mg by mouth daily.   lisinopril 5 MG tablet Commonly known as: ZESTRIL Take 5 mg by mouth daily.   metFORMIN 500 MG tablet Commonly known as: GLUCOPHAGE Take 500 mg by mouth in the morning and at bedtime.   One-A-Day Womens 50+ Tabs Take 1 tablet by mouth daily.   Vitamin D 50 MCG (2000 UT) tablet Take 2,000 Units by mouth daily.      Allergies  Allergen Reactions  . Hydrocodone-Acetaminophen   . Morphine And Related Nausea And Vomiting  . Propranolol Swelling  . Shellfish Allergy     Other reaction(s): Other (See Comments)    Follow-up Information    Linus Salmons, MD. Schedule an appointment as soon as possible for a visit in 1 week(s).   Specialties: Neurology, Psychiatry Why: Hospital follow up Contact information: [address] [place] [address] [phone]                The results of significant diagnostics from this hospitalization (including imaging, microbiology, ancillary and laboratory) are listed below for reference.    Significant Diagnostic Studies: CT HEAD WO CONTRAST  Result Date: 11/27/2020 CLINICAL DATA:  Mental status change, unknown cause EXAM: CT HEAD WITHOUT CONTRAST TECHNIQUE: Contiguous axial images were obtained from the base of the skull through the vertex without intravenous contrast. COMPARISON:  None. FINDINGS: Study is degraded by  beam hardening artifact from metallic densities external to the scalp along the posterior calvarium. Brain: No evidence of acute infarction, hemorrhage, hydrocephalus, extra-axial collection or mass lesion/mass effect. Vascular: No hyperdense vessel or unexpected calcification. Skull: Normal. Negative for fracture or focal lesion. Sinuses/Orbits: No acute finding. Other: None. IMPRESSION: No evidence of acute intracranial abnormality on this study which is slightly degraded by beam hardening artifact. Electronically Signed   By: Maudry Mayhew MD   On: 11/27/2020 16:37   MR ANGIO HEAD WO CONTRAST  Result Date: 11/28/2020 CLINICAL DATA:  Abnormal mental status EXAM: MRI HEAD WITHOUT CONTRAST MRA HEAD WITHOUT CONTRAST MRA NECK WITHOUT CONTRAST TECHNIQUE: Multiplanar, multiecho pulse sequences of the brain and surrounding structures were obtained without intravenous contrast. Angiographic images of the Circle of Willis were obtained using MRA technique without intravenous contrast. Angiographic images of the neck were obtained using MRA technique without intravenous contrast. Carotid stenosis measurements (when applicable) are obtained utilizing NASCET criteria, using the distal internal carotid diameter as the denominator. COMPARISON:  None. FINDINGS: MRI HEAD Brain: There is no acute infarction or intracranial hemorrhage. There is no intracranial mass, mass effect, or edema. There is  no hydrocephalus or extra-axial fluid collection. Ventricles and sulci are within normal limits in size and configuration. Patchy foci of T2 hyperintensity in the supratentorial white matter are nonspecific but may reflect mild chronic microvascular ischemic changes. Vascular: T1 hyperintensity of the visualized left internal carotid artery apart from the distal supraclinoid portion likely reflects slow flow with preservation of flow void on T2. Skull and upper cervical spine: Normal marrow signal is preserved. Sinuses/Orbits: Minor  mucosal thickening.  Orbits are unremarkable. Other: Sella is partially empty. Trace right mastoid fluid opacification. MRA HEAD Intracranial internal carotid arteries are patent. Middle and anterior cerebral arteries are patent. Mild narrowing of the distal M1 MCA is bilaterally. Intracranial vertebral arteries, basilar artery, posterior cerebral arteries are patent. There is no aneurysm. MRA NECK Common, internal, and external carotid arteries are patent. There is plaque at the ICA origins bilaterally. Approximately 50% stenosis is present on the right and less than 50% stenosis is present on the left. Extracranial vertebral arteries are patent. Origins are not well evaluated due to artifact. 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 Codominant vertebrals. IMPRESSION: No acute infarction, hemorrhage, or mass. Mild chronic microvascular ischemic changes. No large vessel occlusion. Plaque at the right ICA origin causes approximately 50% stenosis. Plaque at the left ICA origin causes less than 50% stenosis. Electronically Signed   By: Guadlupe Spanish M.D.   On: 11/28/2020 08:54   MR ANGIO NECK WO CONTRAST  Result Date: 11/28/2020 CLINICAL DATA:  Abnormal mental status EXAM: MRI HEAD WITHOUT CONTRAST MRA HEAD WITHOUT CONTRAST MRA NECK WITHOUT CONTRAST TECHNIQUE: Multiplanar, multiecho pulse sequences of the brain and surrounding structures were obtained without intravenous contrast. Angiographic images of the Circle of Willis were obtained using MRA technique without intravenous contrast. Angiographic images of the neck were obtained using MRA technique without intravenous contrast. Carotid stenosis measurements (when applicable) are obtained utilizing NASCET criteria, using the distal internal carotid diameter as the denominator. COMPARISON:  None. FINDINGS: MRI HEAD Brain: There is no acute infarction or intracranial hemorrhage. There is no intracranial mass, mass effect, or edema. There is no  hydrocephalus or extra-axial fluid collection. Ventricles and sulci are within normal limits in size and configuration. Patchy foci of T2 hyperintensity in the supratentorial white matter are nonspecific but may reflect mild chronic microvascular ischemic changes. Vascular: T1 hyperintensity of the visualized left internal carotid artery apart from the distal supraclinoid portion likely reflects slow flow with preservation of flow void on T2. Skull and upper cervical spine: Normal marrow signal is preserved. Sinuses/Orbits: Minor mucosal thickening.  Orbits are unremarkable. Other: Sella is partially empty. Trace right mastoid fluid opacification. MRA HEAD Intracranial internal carotid arteries are patent. Middle and anterior cerebral arteries are patent. Mild narrowing of the distal M1 MCA is bilaterally. Intracranial vertebral arteries, basilar artery, posterior cerebral arteries are patent. There is no aneurysm. MRA NECK Common, internal, and external carotid arteries are patent. There is plaque at the ICA origins bilaterally. Approximately 50% stenosis is present on the right and less than 50% stenosis is present on the left. Extracranial vertebral arteries are patent. Origins are not well evaluated due to artifact. 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 Codominant vertebrals. IMPRESSION: No acute infarction, hemorrhage, or mass. Mild chronic microvascular ischemic changes. No large vessel occlusion. Plaque at the right ICA origin causes approximately 50% stenosis. Plaque at the left ICA origin causes less than 50% stenosis. Electronically Signed   By: Jackquline Berlin.D.  On: 11/28/2020 08:54   MR BRAIN WO CONTRAST  Result Date: 11/28/2020 CLINICAL DATA:  Abnormal mental status EXAM: MRI HEAD WITHOUT CONTRAST MRA HEAD WITHOUT CONTRAST MRA NECK WITHOUT CONTRAST TECHNIQUE: Multiplanar, multiecho pulse sequences of the brain and surrounding structures were obtained without intravenous  contrast. Angiographic images of the Circle of Willis were obtained using MRA technique without intravenous contrast. Angiographic images of the neck were obtained using MRA technique without intravenous contrast. Carotid stenosis measurements (when applicable) are obtained utilizing NASCET criteria, using the distal internal carotid diameter as the denominator. COMPARISON:  None. FINDINGS: MRI HEAD Brain: There is no acute infarction or intracranial hemorrhage. There is no intracranial mass, mass effect, or edema. There is no hydrocephalus or extra-axial fluid collection. Ventricles and sulci are within normal limits in size and configuration. Patchy foci of T2 hyperintensity in the supratentorial white matter are nonspecific but may reflect mild chronic microvascular ischemic changes. Vascular: T1 hyperintensity of the visualized left internal carotid artery apart from the distal supraclinoid portion likely reflects slow flow with preservation of flow void on T2. Skull and upper cervical spine: Normal marrow signal is preserved. Sinuses/Orbits: Minor mucosal thickening.  Orbits are unremarkable. Other: Sella is partially empty. Trace right mastoid fluid opacification. MRA HEAD Intracranial internal carotid arteries are patent. Middle and anterior cerebral arteries are patent. Mild narrowing of the distal M1 MCA is bilaterally. Intracranial vertebral arteries, basilar artery, posterior cerebral arteries are patent. There is no aneurysm. MRA NECK Common, internal, and external carotid arteries are patent. There is plaque at the ICA origins bilaterally. Approximately 50% stenosis is present on the right and less than 50% stenosis is present on the left. Extracranial vertebral arteries are patent. Origins are not well evaluated due to artifact. 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 Codominant vertebrals. IMPRESSION: No acute infarction, hemorrhage, or mass. Mild chronic microvascular ischemic  changes. No large vessel occlusion. Plaque at the right ICA origin causes approximately 50% stenosis. Plaque at the left ICA origin causes less than 50% stenosis. Electronically Signed   By: Guadlupe Spanish M.D.   On: 11/28/2020 08:54   US Aorta  Result Date: 11/27/2020 CLINICAL DATA:  Hypotension, syncope.  Abdominal aneurysm? EXAM: ULTRASOUND OF ABDOMINAL AORTA TECHNIQUE: Ultrasound examination of the abdominal aorta and proximal common iliac arteries was performed to evaluate for aneurysm. Additional color and Doppler images of the distal aorta were obtained to document patency. COMPARISON:  None. FINDINGS: Abdominal aortic measurements as follows: Proximal:  1.6 x 2.1 cm cm Mid:  1.4 x 2.0 cm cm Distal:  2 x 2.7 cm cm Patent: Yes, peak systolic velocity is 146 cm/s Common iliac arteries: Not seen. IMPRESSION: No abdominal aortic aneurysm. Electronically Signed   By: Bary Richard M.D.   On: 11/27/2020 18:20   DG Chest Port 1 View  Result Date: 11/27/2020 CLINICAL DATA:  Hypertension, syncope, altered mental status. EXAM: PORTABLE CHEST 1 VIEW COMPARISON:  None FINDINGS: Heart is mildly enlarged. No confluent airspace opacities, effusions or edema. Mediastinal contours within normal limits. No acute bony abnormality. IMPRESSION: Cardiomegaly.  No active disease. Electronically Signed   By: Charlett Nose M.D.   On: 11/27/2020 16:49   EEG adult  Result Date: 11/28/2020 Charlsie Quest, MD     11/28/2020 12:17 PM Patient Name: IRAM ASTORINO MRN: [id number] Epilepsy Attending: Charlsie Quest Referring Physician/Provider: Dr Shauna Hugh Date: 11/28/2020 Duration: 20.40 mins Patient history: 65 year old female with transient loss of consciousness.  EEG to  evaluate for seizures. Level of alertness: Awake AEDs during EEG study: None Technical aspects: This EEG study was done with scalp electrodes positioned according to the 10-20 International system of electrode placement. Electrical activity was acquired at  a sampling rate of 500Hz  and reviewed with a high frequency filter of 70Hz  and a low frequency filter of 1Hz . EEG data were recorded continuously and digitally stored. Description: The posterior dominant rhythm consists of 8 Hz activity of moderate voltage (25-35 uV) seen predominantly in posterior head regions, symmetric and reactive to eye opening and eye closing. Physiologic photic driving was not seen during photic stimulation.  Hyperventilation was not performed.   IMPRESSION: This study is within normal limits. No seizures or epileptiform discharges were seen throughout the recording.   ECHOCARDIOGRAM COMPLETE  Result Date: 11/28/2020    ECHOCARDIOGRAM REPORT   Patient Name:   ENAYA HOWZE Date of Exam: 11/28/2020 Medical Rec #:  01/28/2021       Height:       59.0 in Accession #:    [id number] Genta      Weight:       200.0 lb Date of Birth:  1956/07/29       BSA:          1.844 m Patient Age:    64 years        BP:           165/70 mmHg Patient Gender: F               HR:           60 bpm. Exam Location:  Inpatient Procedure: 2D Echo Indications:    syncope  History:        Patient has no prior history of Echocardiogram examinations.                 Risk Factors:Dyslipidemia, Sleep Apnea and Diabetes.  Sonographer:    [id number] Referring Phys: [id number] Insiya Oshea IMPRESSIONS  1. Left ventricular ejection fraction, by estimation, is 60 to 65%. The left ventricle has normal function. The left ventricle has no regional wall motion abnormalities. There is mild asymmetric left ventricular hypertrophy of the basal-septal segment. Left ventricular diastolic parameters were normal.  2. Right ventricular systolic function is normal. The right ventricular size is normal. There is normal pulmonary artery systolic pressure.  3. The mitral valve is normal in structure. Trivial mitral valve regurgitation. No evidence of mitral stenosis.  4. The aortic valve is tricuspid. Aortic valve  regurgitation is not visualized. No aortic stenosis is present.  5. The inferior vena cava is normal in size with greater than 50% respiratory variability, suggesting right atrial pressure of 3 mmHg. Comparison(s): No prior Echocardiogram. FINDINGS  Left Ventricle: Left ventricular ejection fraction, by estimation, is 60 to 65%. The left ventricle has normal function. The left ventricle has no regional wall motion abnormalities. The left ventricular internal cavity size was normal in size. There is  mild asymmetric left ventricular hypertrophy of the basal-septal segment. Left ventricular diastolic parameters were normal. Right Ventricle: The right ventricular size is normal. No increase in right ventricular wall thickness. Right ventricular systolic function is normal. There is normal pulmonary artery systolic pressure. The tricuspid regurgitant velocity is 2.58 m/s, and  with an assumed right atrial pressure of 3 mmHg, the estimated right ventricular systolic pressure is 29.6 mmHg. Left Atrium: Left atrial size was normal in size. Right Atrium: Right atrial size was normal  in size. Pericardium: There is no evidence of pericardial effusion. Mitral Valve: The mitral valve is normal in structure. Trivial mitral valve regurgitation. No evidence of mitral valve stenosis. Tricuspid Valve: The tricuspid valve is normal in structure. Tricuspid valve regurgitation is mild. Aortic Valve: The aortic valve is tricuspid. Aortic valve regurgitation is not visualized. No aortic stenosis is present. Pulmonic Valve: The pulmonic valve was normal in structure. Pulmonic valve regurgitation is trivial. Aorta: The aortic root and ascending aorta are structurally normal, with no evidence of dilitation. Venous: The inferior vena cava is normal in size with greater than 50% respiratory variability, suggesting right atrial pressure of 3 mmHg. IAS/Shunts: No atrial level shunt detected by color flow Doppler.  LEFT VENTRICLE PLAX 2D LVIDd:          4.90 cm  Diastology LVIDs:         2.80 cm  LV e' medial:    6.31 cm/s LV PW:         0.90 cm  LV E/e' medial:  14.1 LV IVS:        1.00 cm  LV e' lateral:   9.14 cm/s LVOT diam:     1.80 cm  LV E/e' lateral: 9.7 LV SV:         72 LV SV Index:   39 LVOT Area:     2.54 cm  RIGHT VENTRICLE             IVC RV S prime:     14.70 cm/s  IVC diam: 2.00 cm TAPSE (M-mode): 3.3 cm LEFT ATRIUM             Index       RIGHT ATRIUM           Index LA diam:        4.40 cm 2.39 cm/m  RA Area:     12.30 cm LA Vol (A2C):   44.4 ml 24.07 ml/m RA Volume:   25.20 ml  13.66 ml/m LA Vol (A4C):   51.1 ml 27.71 ml/m LA Biplane Vol: 51.8 ml 28.09 ml/m  AORTIC VALVE LVOT Vmax:   112.00 cm/s LVOT Vmean:  76.300 cm/s LVOT VTI:    0.281 m  AORTA Ao Root diam: 2.90 cm Ao Asc diam:  3.10 cm MITRAL VALVE               TRICUSPID VALVE MV Area (PHT): 3.21 cm    TR Peak grad:   26.6 mmHg MV Decel Time: 236 msec    TR Vmax:        258.00 cm/s MV E velocity: 89.10 cm/s MV A velocity: 67.30 cm/s  SHUNTS MV E/A ratio:  1.32        Systemic VTI:  0.28 m                            Systemic Diam: 1.80 cm Laurance Flatten MD Electronically signed by Laurance Flatten MD Signature Date/Time: 11/28/2020/5:55:42 PM    Final     Microbiology: Recent Results (from the past 240 hour(s))  SARS CORONAVIRUS 2 (TAT 6-24 HRS) Nasopharyngeal Nasopharyngeal Swab     Status: None   Collection Time: 11/27/20  9:34 PM   Specimen: Nasopharyngeal Swab  Result Value Ref Range Status   SARS Coronavirus 2 NEGATIVE NEGATIVE Final    Comment: (NOTE) SARS-CoV-2 target nucleic acids are NOT DETECTED.  The SARS-CoV-2 RNA is generally detectable in upper and  lower respiratory specimens during the acute phase of infection. Negative results do not preclude SARS-CoV-2 infection, do not rule out co-infections with other pathogens, and should not be used as the sole basis for treatment or other patient management decisions. Negative results must be combined  with clinical observations, patient history, and epidemiological information. The expected result is Negative.  Fact Sheet for Patients: HairSlick.no  Fact Sheet for Healthcare Providers: quierodirigir.com  This test is not yet approved or cleared by the [place] FDA and  has been authorized for detection and/or diagnosis of SARS-CoV-2 by FDA under an Emergency Use Authorization (EUA). This EUA will remain  in effect (meaning this test can be used) for the duration of the COVID-19 declaration under Se ction 564(b)(1) of the Act, 21 U.S.C. section 360bbb-3(b)(1), unless the authorization is terminated or revoked sooner.  Performed at [facility], [address], [place], [place] [address]      Labs: Basic Metabolic Panel: Recent Labs  Lab 11/27/20 1600 11/28/20 0323 11/28/20 1639  NA 131* 131*  --   K 3.8 4.2  --   CL 99 103  --   CO2 19* 18*  --   GLUCOSE 131* 104*  --   BUN 10 8  --   CREATININE 0.88 0.69 0.88  CALCIUM 9.2 8.2*  --    Liver Function Tests: Recent Labs  Lab 11/27/20 1600 11/28/20 0323  AST 19 16  ALT 15 14  ALKPHOS 37* 31*  BILITOT 0.8 0.7  PROT 6.6 5.5*  ALBUMIN 3.5 2.9*   No results for input(s): LIPASE, AMYLASE in the last 168 hours. No results for input(s): AMMONIA in the last 168 hours. CBC: Recent Labs  Lab 11/27/20 1600 11/28/20 0323 11/28/20 1639  WBC 8.3 8.8 8.6  NEUTROABS 5.1  --   --   HGB 13.0 11.5* 12.2  HCT 38.7 33.8* 37.5  MCV 88.2 89.9 89.5  PLT 411* 338 368   Cardiac Enzymes: No results for input(s): CKTOTAL, CKMB, CKMBINDEX, TROPONINI in the last 168 hours. BNP: BNP (last 3 results) No results for input(s): BNP in the last 8760 hours.  ProBNP (last 3 results) No results for input(s): PROBNP in the last 8760 hours.  CBG: Recent Labs  Lab 11/28/20 0816 11/28/20 1203 11/28/20 1702 11/28/20 2330 11/29/20 0618  GLUCAP 104* 99 90 99 114*        Signed:  Novice Vrba  [facility] 11/29/2020, 9:53 AM

## 2020-11-29 NOTE — Progress Notes (Signed)
Physical Therapy Treatment Patient Details Name: Sydney Baird MRN: [id number] DOB: December 29, 1955 Today's Date: 11/29/2020    History of Present Illness This 65 y.o. female presented to ED with heaviness/tingling Lt side of body followed by syncopal type of event. She was found to be extremely hypotensive 95/35.  CT and MRI/MRA showed no acute infarct, but bil. ICA stenosis.  PMH includes: OSA, DM, TIA    PT Comments    Patient progressing well towards PT goals. Reports some anxiety initially with mobility. Tolerated transfers and gait training with Min guard assist for safety due to first time being up walking since admission. Reports headache which is not abnormal. No dizziness reported today with position changes. Sitting BP 146/80, standing BP 133/45, standing post gait 135/65, asymptomatic. HR stable. Education re: taking time between transitions at home to allow BP to stabilize prior to mobility. Pt eager to return home today. Will follow if still in the hospital.    Follow Up Recommendations  No PT follow up;Supervision - Intermittent     Equipment Recommendations  None recommended by PT    Recommendations for Other Services       Precautions / Restrictions Precautions Precautions: Fall;Other (comment) Precaution Comments: watch BP Restrictions Weight Bearing Restrictions: No    Mobility  Bed Mobility Overal bed mobility: Modified Independent Bed Mobility: Supine to Sit     Supine to sit: Modified independent (Device/Increase time)     General bed mobility comments: No assist needed, HOB elevated. no dizziness.    Transfers Overall transfer level: Needs assistance Equipment used: None Transfers: Sit to/from Stand Sit to Stand: Min guard         General transfer comment: Min guard for safety. Stood from [employer], from toilet x1 reaching for rail for support. no dizziness.  Ambulation/Gait Ambulation/Gait assistance: Min guard Gait Distance (Feet): 200  Feet Assistive device: None Gait Pattern/deviations: Step-through pattern;Decreased stride length Gait velocity: decreased   General Gait Details: Slow, mildly unsteady gait, no overt LOB. Reports feeling slightly weak from being in the bed. Also reports being anxious.   Stairs             Wheelchair Mobility    Modified Rankin (Stroke Patients Only)       Balance Overall balance assessment: Needs assistance Sitting-balance support: Feet supported;No upper extremity supported Sitting balance-Leahy Scale: Good Sitting balance - Comments: supervision for safety.   Standing balance support: During functional activity Standing balance-Leahy Scale: Fair Standing balance comment: initially needs UE support but eventually does not with mobility                            Cognition Arousal/Alertness: Awake/alert Behavior During Therapy: Anxious Overall Cognitive Status: Within Functional Limits for tasks assessed                                        Exercises      General Comments General comments (skin integrity, edema, etc.): Sitting BP 146/80, standing BP 133/45, standing post gait 135/65, asymptomatic. HR stable.      Pertinent Vitals/Pain Pain Assessment: Faces Faces Pain Scale: Hurts a little bit Pain Location: headache Pain Descriptors / Indicators: Headache Pain Intervention(s): Monitored during session;Repositioned    Home Living  Prior Function            PT Goals (current goals can now be found in the care plan section) Progress towards PT goals: Progressing toward goals    Frequency    Min 3X/week      PT Plan Current plan remains appropriate    Co-evaluation              AM-PAC PT "6 Clicks" Mobility   Outcome Measure  Help needed turning from your back to your side while in a flat bed without using bedrails?: None Help needed moving from lying on your back to sitting  on the side of a flat bed without using bedrails?: None Help needed moving to and from a bed to a chair (including a wheelchair)?: A Little Help needed standing up from a chair using your arms (e.g., wheelchair or bedside chair)?: A Little Help needed to walk in hospital room?: A Little Help needed climbing 3-5 steps with a railing? : A Little 6 Click Score: 20    End of Session Equipment Utilized During Treatment: Gait belt Activity Tolerance: Patient tolerated treatment well Patient left: in bed;with call bell/phone within reach Nurse Communication: Mobility status PT Visit Diagnosis: Unsteadiness on feet (R26.81)     Time: 2423-5361 PT Time Calculation (min) (ACUTE ONLY): 23 min  Charges:  $Gait Training: 8-22 mins $Therapeutic Activity: 8-22 mins                     Sydney Baird, PT, DPT Acute Rehabilitation Services Pager [phone] Office [phone]       Sydney Baird 11/29/2020, 11:35 AM

## 2020-11-29 NOTE — Discharge Instructions (Signed)
Syncope Syncope is when you pass out (faint) for a short time. It is caused by a sudden decrease in blood flow to the brain. Signs that you may be about to pass out include:  Feeling dizzy or light-headed.  Feeling sick to your stomach (nauseous).  Seeing all white or all black.  Having cold, clammy skin. If you pass out, get help right away. Call your local emergency services (911 in the [place] Do not drive yourself to the hospital. Follow these instructions at home: Watch for any changes in your symptoms. Take these actions to stay safe and help with your symptoms: Lifestyle  Do not drive, use machinery, or play sports until your doctor says it is okay.  Do not drink alcohol.  Do not use any products that contain nicotine or tobacco, such as cigarettes and e-cigarettes. If you need help quitting, ask your doctor.  Drink enough fluid to keep your pee (urine) pale yellow. General instructions  Take over-the-counter and prescription medicines only as told by your doctor.  If you are taking blood pressure or heart medicine, sit up and stand up slowly. Spend a few minutes getting ready to sit and then stand. This can help you feel less dizzy.  Have someone stay with you until you feel stable.  If you start to feel like you might pass out, lie down right away and raise (elevate) your feet above the level of your heart. Breathe deeply and steadily. Wait until all of the symptoms are gone.  Keep all follow-up visits as told by your doctor. This is important. Get help right away if:  You have a very bad headache.  You pass out once or more than once.  You have pain in your chest, belly, or back.  You have a very fast or uneven heartbeat (palpitations).  It hurts to breathe.  You are bleeding from your mouth or your bottom (rectum).  You have black or tarry poop (stool).  You have jerky movements that you cannot control (seizure).  You are confused.  You have trouble  walking.  You are very weak.  You have vision problems. These symptoms may be an emergency. Do not wait to see if the symptoms will go away. Get medical help right away. Call your local emergency services (911 in the [place] Do not drive yourself to the hospital. Summary  Syncope is when you pass out (faint) for a short time. It is caused by a sudden decrease in blood flow to the brain.  Signs that you may be about to faint include feeling dizzy, light-headed, or sick to your stomach, seeing all white or all black, or having cold, clammy skin.  If you start to feel like you might pass out, lie down right away and raise (elevate) your feet above the level of your heart. Breathe deeply and steadily. Wait until all of the symptoms are gone. This information is not intended to replace advice given to you by your health care provider. Make sure you discuss any questions you have with your health care provider. Document Revised: 10/25/2019 Document Reviewed: 10/27/2017 Elsevier Patient Education  2021 [employer]  

## 2020-11-29 NOTE — Progress Notes (Signed)
SLP Cancellation Note  Patient Details Name: Sydney Baird MRN: [id number] DOB: 1956/04/01   Cancelled treatment:       Reason Eval/Treat Not Completed: SLP screened, no needs identified, will sign off PT and OT did not observe any communication/cognitive-linguistic deficits and MRI was negative for acute infarct. Formal evaluation does appear to be clinically indicated at this time. SLP will s/o.   Sydney I. Vear Clock, MS, CCC-SLP Acute Rehabilitation Services Office number [phone] Pager [phone]  Sydney Baird 11/29/2020, 8:22 AM
